# Patient Record
Sex: Male | Born: 1965 | Race: White | Hispanic: No | Marital: Married | Smoking: Never smoker
Health system: Southern US, Community
[De-identification: ages and names within clinical notes are randomized; demographics above are authoritative.]

## PROBLEM LIST (undated history)

## (undated) DIAGNOSIS — I10 Essential (primary) hypertension: Secondary | ICD-10-CM

## (undated) DIAGNOSIS — E785 Hyperlipidemia, unspecified: Secondary | ICD-10-CM

## (undated) DIAGNOSIS — G4730 Sleep apnea, unspecified: Secondary | ICD-10-CM

## (undated) DIAGNOSIS — T7840XA Allergy, unspecified, initial encounter: Secondary | ICD-10-CM

## (undated) HISTORY — PX: COLONOSCOPY: SHX174

## (undated) HISTORY — DX: Hyperlipidemia, unspecified: E78.5

## (undated) HISTORY — PX: MANDIBLE SURGERY: SHX707

## (undated) HISTORY — DX: Allergy, unspecified, initial encounter: T78.40XA

## (undated) HISTORY — DX: Sleep apnea, unspecified: G47.30

## (undated) HISTORY — DX: Essential (primary) hypertension: I10

## (undated) HISTORY — PX: TONSILLECTOMY: SUR1361

## (undated) HISTORY — PX: KNEE SURGERY: SHX244

---

## 2004-12-05 ENCOUNTER — Ambulatory Visit (HOSPITAL_COMMUNITY): Admission: RE | Admit: 2004-12-05 | Discharge: 2004-12-05 | Payer: Self-pay | Admitting: Internal Medicine

## 2009-03-17 ENCOUNTER — Emergency Department (HOSPITAL_COMMUNITY): Admission: EM | Admit: 2009-03-17 | Discharge: 2009-03-17 | Payer: Self-pay | Admitting: Emergency Medicine

## 2009-08-07 ENCOUNTER — Encounter (INDEPENDENT_AMBULATORY_CARE_PROVIDER_SITE_OTHER): Payer: Self-pay | Admitting: *Deleted

## 2009-10-10 ENCOUNTER — Encounter (INDEPENDENT_AMBULATORY_CARE_PROVIDER_SITE_OTHER): Payer: Self-pay | Admitting: *Deleted

## 2009-10-14 ENCOUNTER — Telehealth: Payer: Self-pay | Admitting: Internal Medicine

## 2009-10-14 ENCOUNTER — Ambulatory Visit: Payer: Self-pay | Admitting: Internal Medicine

## 2009-10-17 ENCOUNTER — Ambulatory Visit: Payer: Self-pay | Admitting: Internal Medicine

## 2009-10-19 ENCOUNTER — Encounter: Payer: Self-pay | Admitting: Internal Medicine

## 2010-04-15 NOTE — Letter (Signed)
Summary: Patient Notice- Polyp Results   Gastroenterology  [address] [place], [place] [address]   Phone: [phone]  Fax: [phone]        October 19, 2009 MRN: [id number]    Jason Rivas [address] [place], [place]  [address]    Dear [name],  I am pleased to inform you that the colon polyp(s) removed during your recent colonoscopy was (were) found to be benign (no cancer detected) upon pathologic examination.  I recommend you have a repeat colonoscopy examination in 5 years to look for recurrent polyps, as having colon polyps increases your risk for having recurrent polyps or even colon cancer in the future.  Should you develop new or worsening symptoms of abdominal pain, bowel habit changes or bleeding from the rectum or bowels, please schedule an evaluation with either your primary care physician or with me.  Additional information/recommendations:  __ No further action with gastroenterology is needed at this time. Please      follow-up with your primary care physician for your other healthcare      needs.   Please call us if you are having persistent problems or have questions about your condition that have not been fully answered at this time.  Sincerely,  Hilarie Fredrickson MD  This letter has been electronically signed by your physician.  Appended Document: Patient Notice- Polyp Results letter mailed

## 2010-04-15 NOTE — Procedures (Signed)
Summary: Colonoscopy  Patient: Jason Rivas Note: All result statuses are Final unless otherwise noted.  Tests: (1) Colonoscopy (COL)   COL Colonoscopy           DONE     [facility]     [address]     [place], [place]  [address]           COLONOSCOPY PROCEDURE REPORT           PATIENT:  Jason Rivas, Jason Rivas  MR#:  [id number]     BIRTHDATE:  1965-06-08, 43 yrs. old  GENDER:  male     ENDOSCOPIST:  Wilhemina Bonito. Eda Keys, MD     REF. BY:  Creola Corn, M.D.     PROCEDURE DATE:  10/17/2009     PROCEDURE:  Colonoscopy with snare polypectomy x 1     ASA CLASS:  Class I     INDICATIONS:  Elevated Risk Screening ; mom w/ CRC age 30     MEDICATIONS:   Fentanyl 100 mcg IV, Versed 10 mg IV, Benadryl 50     mg IV           DESCRIPTION OF PROCEDURE:   After the risks benefits and     alternatives of the procedure were thoroughly explained, informed     consent was obtained.  Digital rectal exam was performed and     revealed no abnormalities.   The LB [id number] endoscope     was introduced through the anus and advanced to the cecum, which     was identified by both the appendix and ileocecal valve, without     limitations.Time to cecum = 1:48 min. The quality of the prep was     excellent, using MoviPrep.  The instrument was then slowly     withdrawn (time = 14:41 min.) as the colon was fully examined.     <<PROCEDUREIMAGES>>           FINDINGS:  A 5mm pedunculated polyp was found in the sigmoid     colon. Polyp was snared without cautery. Retrieval was successful.     Mild diverticulosis was found in the sigmoid colon.  This was     otherwise a normal examination of the colon.   Retroflexed views     in the rectum revealed internal hemorrhoids.    The scope was then     withdrawn from the patient and the procedure completed.           COMPLICATIONS:  None           ENDOSCOPIC IMPRESSION:     1) Pedunculated polyp in the sigmoid colon - removed     2) Mild diverticulosis  in the sigmoid colon     3) Otherwise normal examination     4) Small Internal hemorrhoids           RECOMMENDATIONS:     1) Follow up colonoscopy in 5 years           ______________________________     Wilhemina Bonito. Eda Keys, MD           CC:  Creola Corn, MD;The Patient           n.     Rosalie DoctorWilhemina Bonito. Eda Keys at 10/17/2009 09:20 AM           Shelly Flatten, [id number]  Note: An exclamation mark (!) indicates a result that was not dispersed into the  flowsheet. Document Creation Date: 10/17/2009 9:20 AM _______________________________________________________________________  (1) Order result status: Final Collection or observation date-time: 10/17/2009 09:13 Requested date-time:  Receipt date-time:  Reported date-time:  Referring Physician:   Ordering Physician: Fransico Setters [phone]) Specimen Source:  Source: Launa Grill Order Number: [id number] Lab site:   Appended Document: Colonoscopy     Procedures Next Due Date:    Colonoscopy: 10/2014

## 2010-04-15 NOTE — Miscellaneous (Signed)
Summary: [facility] PV  Clinical Lists Changes  Medications: Added new medication of MOVIPREP 100 GM  SOLR (PEG-KCL-NACL-NASULF-NA ASC-C) As per prep instructions. - Signed Rx of MOVIPREP 100 GM  SOLR (PEG-KCL-NACL-NASULF-NA ASC-C) As per prep instructions.;  #1 x 0;  Signed;  Entered by: Ezra Sites RN;  Authorized by: Hilarie Fredrickson MD;  Method used: Electron[facility] to [facility] [address] [id number]*, [address], [place], [place], [place]  [address], Ph: [phone] or [phone], Fax: [phone] Observations: Added new observation of NKA: T (10/14/2009 7:52)    Prescriptions: MOVIPREP 100 GM  SOLR (PEG-KCL-NACL-NASULF-NA ASC-C) As per prep instructions.  #1 x 0   Entered by:   Ezra Sites RN   Authorized by:   Hilarie Fredrickson MD   Signed by:   Ezra Sites RN on 10/14/2009   Method used:   [facility] to        [facility] [address] [phone]* (retail)       [address]       [place], [place]  [address]       Ph: [phone] or [phone]       Fax: [phone]   RxID:   [id number]

## 2010-04-15 NOTE — Progress Notes (Signed)
Summary: speak to Doc  Phone Note Call from Patient Call back at Home Phone [phone]   Caller: Patient Call For: Marina Goodell Reason for Call: Talk to Nurse Summary of Call: Patient wants to speak to Dr Marina Goodell directly regarding procedure Thrus. Initial call taken by: Tawni Levy,  October 14, 2009 8:33 AM  Follow-up for Phone Call        Explained to pt.the policy that due to sedation someone over 18 must  be here with pt. and drive them home.Says he has had colon elsewhere before and no one had to stay. Claiming this is a ridiculous  policy as his wife's time is too valuable to sit here for 3 hrs.says he may cx. procedure that he will call back. Follow-up by: Teryl Lucy RN,  October 14, 2009 10:06 AM  Additional Follow-up for Phone Call Additional follow up Details #1::        Dixie. Please try to explain our policy to this patient Additional Follow-up by: Hilarie Fredrickson MD,  October 14, 2009 10:19 AM    Additional Follow-up for Phone Call Additional follow up Details #2::    called pt at 1530; left message on ID'd voice mail to please call me back. Follow-up by: Quincy Carnes RN,  October 14, 2009 3:30 PM  Additional Follow-up for Phone Call Additional follow up Details #3:: Details for Additional Follow-up Action Taken: ok, thanks.  Hilarie Fredrickson MD  October 15, 2009 8:26 AM    Appended Document: speak to Doc Spoke with Patient on 10/15/09; arrangements made for spouse to arrive at 9:15.  Patient to be done on schedule.  Approved by Dr. Marina Goodell.

## 2010-04-15 NOTE — Letter (Signed)
Summary: Previsit letter  [facility]  [address] [place], [place] [address]   Phone: [phone]  Fax: [phone]       08/07/2009 MRN: [id number]  Jason Rivas [address] [place], [place]  [address]  Dear [name],  Welcome to the Gastroenterology Division at [facility].    You are scheduled to see a nurse for your pre-procedure visit on 10-14-09 at 8:00a.m. on the 3rd floor at [facility], [address]  We ask that you try to arrive at our office 15 minutes prior to your appointment time to allow for check-in.  Your nurse visit will consist of discussing your medical and surgical history, your immediate family medical history, and your medications.    Please bring a complete list of all your medications or, if you prefer, bring the medication bottles and we will list them.  We will need to be aware of both prescribed and over the counter drugs.  We will need to know exact dosage information as well.  If you are on blood thinners (Coumadin, Plavix, Aggrenox, Ticlid, etc.) please call our office today/prior to your appointment, as we need to consult with your physician about holding your medication.   Please be prepared to read and sign documents such as consent forms, a financial agreement, and acknowledgement forms.  If necessary, and with your consent, a friend or relative is welcome to sit-in on the nurse visit with you.  Please bring your insurance card so that we may make a copy of it.  If your insurance requires a referral to see a specialist, please bring your referral form from your primary care physician.  No co-pay is required for this nurse visit.     If you cannot keep your appointment, please call [phone] to cancel or reschedule prior to your appointment date.  This allows [place] the opportunity to schedule an appointment for another patient in need of care.    Thank you for choosing [facility] for your medical needs.   We appreciate the opportunity to care for you.  Please visit [place] at our website  to learn more about our practice.                     Sincerely.                                                                                                                   The Gastroenterology Division

## 2010-04-15 NOTE — Letter (Signed)
Summary: [facility] Instructions  [facility]  [address] [place], [place] [address]   Phone: [phone]  Fax: [phone]       Jason Rivas    Oct 30, 1965    MRN: [id number]        Procedure Day /Date:  Thursday 10/17/2009     Arrival Time: 7:30 am      Procedure Time: 8:30 am     Location of Procedure:                    _x _  [facility] (4th Floor)                        PREPARATION FOR COLONOSCOPY WITH MOVIPREP   Starting 5 days prior to your procedure Saturday 7/30 do not eat nuts, seeds, popcorn, corn, beans, peas,  salads, or any raw vegetables.  Do not take any fiber supplements (e.g. Metamucil, Citrucel, and Benefiber).  THE DAY BEFORE YOUR PROCEDURE         DATE: Wednesday 8/3  1.  Drink clear liquids the entire day-NO SOLID FOOD  2.  Do not drink anything colored red or purple.  Avoid juices with pulp.  No orange juice.  3.  Drink at least 64 oz. (8 glasses) of fluid/clear liquids during the day to prevent dehydration and help the prep work efficiently.  CLEAR LIQUIDS INCLUDE: Water Jello Ice Popsicles Tea (sugar ok, no milk/cream) Powdered fruit flavored drinks Coffee (sugar ok, no milk/cream) Gatorade Juice: apple, white grape, white cranberry  Lemonade Clear bullion, consomm, broth Carbonated beverages (any kind) Strained chicken noodle soup Hard Candy                             4.  In the morning, mix first dose of MoviPrep solution:    Empty 1 Pouch A and 1 Pouch B into the disposable container    Add lukewarm drinking water to the top line of the container. Mix to dissolve    Refrigerate (mixed solution should be used within 24 hrs)  5.  Begin drinking the prep at 5:00 p.m. The MoviPrep container is divided by 4 marks.   Every 15 minutes drink the solution down to the next mark (approximately 8 oz) until the full liter is complete.   6.  Follow completed prep with 16 oz of clear liquid of your choice  (Nothing red or purple).  Continue to drink clear liquids until bedtime.  7.  Before going to bed, mix second dose of MoviPrep solution:    Empty 1 Pouch A and 1 Pouch B into the disposable container    Add lukewarm drinking water to the top line of the container. Mix to dissolve    Refrigerate  THE DAY OF YOUR PROCEDURE      DATE: Thursday 8/4  Beginning at 3:30 a.m. (5 hours before procedure):         1. Every 15 minutes, drink the solution down to the next mark (approx 8 oz) until the full liter is complete.  2. Follow completed prep with 16 oz. of clear liquid of your choice.    3. You may drink clear liquids until 6:30 am (2 HOURS BEFORE PROCEDURE).   MEDICATION INSTRUCTIONS  Unless otherwise instructed, you should take regular prescription medications with a small sip of water   as early as possible the morning of  your procedure.         OTHER INSTRUCTIONS  You will need a responsible adult at least 47 years of age to accompany you and drive you home.   This person must remain in the waiting room during your procedure.  Wear loose fitting clothing that is easily removed.  Leave jewelry and other valuables at home.  However, you may wish to bring a book to read or  an iPod/MP3 player to listen to music as you wait for your procedure to start.  Remove all body piercing jewelry and leave at home.  Total time from sign-in until discharge is approximately 2-3 hours.  You should go home directly after your procedure and rest.  You can resume normal activities the  day after your procedure.  The day of your procedure you should not:   Drive   Make legal decisions   Operate machinery   Drink alcohol   Return to work  You will receive specific instructions about eating, activities and medications before you leave.    The above instructions have been reviewed and explained to me by   Ezra Sites RN  October 14, 2009 8:13 AM     I fully understand and  can verbalize these instructions _____________________________ Date _________

## 2010-04-23 ENCOUNTER — Other Ambulatory Visit: Payer: Self-pay | Admitting: Dermatology

## 2010-06-01 LAB — URINALYSIS, ROUTINE W REFLEX MICROSCOPIC
Bilirubin Urine: NEGATIVE
Glucose, UA: NEGATIVE mg/dL
Ketones, ur: NEGATIVE mg/dL
Nitrite: NEGATIVE
Protein, ur: NEGATIVE mg/dL
Specific Gravity, Urine: 1.018 (ref 1.005–1.030)
pH: 8 (ref 5.0–8.0)

## 2010-06-01 LAB — CBC
Platelets: 245 10*3/uL (ref 150–400)
RBC: 5.07 MIL/uL (ref 4.22–5.81)

## 2010-06-01 LAB — COMPREHENSIVE METABOLIC PANEL
AST: 46 U/L — ABNORMAL HIGH (ref 0–37)
Albumin: 4.6 g/dL (ref 3.5–5.2)
Alkaline Phosphatase: 65 U/L (ref 39–117)
BUN: 16 mg/dL (ref 6–23)
Calcium: 9.5 mg/dL (ref 8.4–10.5)
Creatinine, Ser: 0.98 mg/dL (ref 0.4–1.5)
GFR calc Af Amer: >60 mL/min (ref >60)

## 2010-06-01 LAB — DIFFERENTIAL
Basophils Absolute: 0 10*3/uL (ref 0.0–0.1)
Basophils Relative: 0 % (ref 0–1)
Eosinophils Absolute: 0 10*3/uL (ref 0.0–0.7)
Eosinophils Relative: 0 % (ref 0–5)
Lymphocytes Relative: 11 % — ABNORMAL LOW (ref 12–46)
Neutrophils Relative %: 86 % — ABNORMAL HIGH (ref 43–77)

## 2013-10-31 ENCOUNTER — Encounter: Payer: Self-pay | Admitting: Internal Medicine

## 2014-02-27 ENCOUNTER — Other Ambulatory Visit: Payer: Self-pay | Admitting: Plastic Surgery

## 2014-07-06 ENCOUNTER — Other Ambulatory Visit: Payer: Self-pay | Admitting: Orthopedic Surgery

## 2014-07-11 ENCOUNTER — Other Ambulatory Visit: Payer: Self-pay | Admitting: Orthopedic Surgery

## 2014-07-11 DIAGNOSIS — M25512 Pain in left shoulder: Secondary | ICD-10-CM

## 2014-07-31 ENCOUNTER — Ambulatory Visit
Admission: RE | Admit: 2014-07-31 | Discharge: 2014-07-31 | Disposition: A | Discharge status: Home / Self Care | Payer: BLUE CROSS/BLUE SHIELD | Source: Ambulatory Visit | Attending: Orthopedic Surgery | Admitting: Orthopedic Surgery

## 2014-07-31 DIAGNOSIS — M25512 Pain in left shoulder: Secondary | ICD-10-CM

## 2014-07-31 MED ORDER — IOHEXOL 180 MG/ML  SOLN
15.0000 mL | Freq: Once | INTRAMUSCULAR | Status: AC | PRN
  Administered 2014-07-31: 15 mL via INTRA_ARTICULAR

## 2014-10-03 ENCOUNTER — Encounter: Payer: Self-pay | Admitting: Internal Medicine

## 2014-12-04 ENCOUNTER — Encounter: Payer: Self-pay | Admitting: Internal Medicine

## 2015-01-08 ENCOUNTER — Ambulatory Visit (AMBULATORY_SURGERY_CENTER): Payer: Self-pay | Admitting: *Deleted

## 2015-01-08 VITALS — Ht 71.0 in | Wt 203.4 lb

## 2015-01-08 DIAGNOSIS — Z800 Family history of malignant neoplasm of digestive organs: Secondary | ICD-10-CM

## 2015-01-08 DIAGNOSIS — Z86010 Personal history of colonic polyps: Secondary | ICD-10-CM

## 2015-01-08 NOTE — Progress Notes (Signed)
No egg or soy allergy  No anesthesia or intubation problems per pt  No diet medications taken  Suprep sample given to pt

## 2015-01-22 ENCOUNTER — Encounter: Payer: Self-pay | Admitting: Internal Medicine

## 2015-01-22 ENCOUNTER — Ambulatory Visit (AMBULATORY_SURGERY_CENTER): Payer: BLUE CROSS/BLUE SHIELD | Admitting: Internal Medicine

## 2015-01-22 VITALS — BP 124/63 | HR 58 | Temp 97.7°F | Resp 22 | Ht 71.0 in | Wt 203.0 lb

## 2015-01-22 DIAGNOSIS — K635 Polyp of colon: Secondary | ICD-10-CM

## 2015-01-22 DIAGNOSIS — D123 Benign neoplasm of transverse colon: Secondary | ICD-10-CM | POA: Diagnosis not present

## 2015-01-22 DIAGNOSIS — Z800 Family history of malignant neoplasm of digestive organs: Secondary | ICD-10-CM

## 2015-01-22 DIAGNOSIS — Z86010 Personal history of colonic polyps: Secondary | ICD-10-CM | POA: Diagnosis not present

## 2015-01-22 HISTORY — PX: COLONOSCOPY: SHX174

## 2015-01-22 MED ORDER — SODIUM CHLORIDE 0.9 % IV SOLN: 500.0000 mL | INTRAVENOUS | Status: DC

## 2015-01-22 NOTE — Patient Instructions (Signed)
YOU HAD AN ENDOSCOPIC PROCEDURE TODAY AT THE [facility]:   Refer to the procedure report that was given to you for any specific questions about what was found during the examination.  If the procedure report does not answer your questions, please call your gastroenterologist to clarify.  If you requested that your care partner not be given the details of your procedure findings, then the procedure report has been included in a sealed envelope for you to review at your convenience later.  YOU SHOULD EXPECT: Some feelings of bloating in the abdomen. Passage of more gas than usual.  Walking can help get rid of the air that was put into your GI tract during the procedure and reduce the bloating. If you had a lower endoscopy (such as a colonoscopy or flexible sigmoidoscopy) you may notice spotting of blood in your stool or on the toilet paper. If you underwent a bowel prep for your procedure, you may not have a normal bowel movement for a few days.  Please Note:  You might notice some irritation and congestion in your nose or some drainage.  This is from the oxygen used during your procedure.  There is no need for concern and it should clear up in a day or so.  SYMPTOMS TO REPORT IMMEDIATELY:   Following lower endoscopy (colonoscopy or flexible sigmoidoscopy):  Excessive amounts of blood in the stool  Significant tenderness or worsening of abdominal pains  Swelling of the abdomen that is new, acute  Fever of 100F or higher   For urgent or emergent issues, a gastroenterologist can be reached at any hour by calling [phone].   DIET: Your first meal following the procedure should be a small meal and then it is ok to progress to your normal diet. Heavy or fried foods are harder to digest and may make you feel nauseous or bloated.  Likewise, meals heavy in dairy and vegetables can increase bloating.  Drink plenty of fluids but you should avoid alcoholic beverages for 24  hours.  ACTIVITY:  You should plan to take it easy for the rest of today and you should NOT DRIVE or use heavy machinery until tomorrow (because of the sedation medicines used during the test).    FOLLOW UP: Our staff will call the number listed on your records the next business day following your procedure to check on you and address any questions or concerns that you may have regarding the information given to you following your procedure. If we do not reach you, we will leave a message.  However, if you are feeling well and you are not experiencing any problems, there is no need to return our call.  We will assume that you have returned to your regular daily activities without incident.  If any biopsies were taken you will be contacted by phone or by letter within the next 1-3 weeks.  Please call us at [phone] if you have not heard about the biopsies in 3 weeks.    SIGNATURES/CONFIDENTIALITY: You and/or your care partner have signed paperwork which will be entered into your electronic medical record.  These signatures attest to the fact that that the information above on your After Visit Summary has been reviewed and is understood.  Full responsibility of the confidentiality of this discharge information lies with you and/or your care-partner.  Polyp, diverticulosis, and high fiber diet information given.  

## 2015-01-22 NOTE — Op Note (Signed)
[facility]  [address] [place], [address]   COLONOSCOPY PROCEDURE REPORT  PATIENT: Jason Rivas, Jason Rivas  MR#: [id number] BIRTHDATE: 06/14/1965 , 49  yrs. old GENDER: male ENDOSCOPIST: Eustace Quail, MD REFERRED VO:[id number] Program Recall PROCEDURE DATE:  01/22/2015 PROCEDURE:   Colonoscopy, surveillance and Colonoscopy with snare polypectomy x 1 First Screening Colonoscopy - Avg.  risk and is 50 yrs.  old or older - No.  Prior Negative Screening - Now for repeat screening. N/A  History of Adenoma - Now for follow-up colonoscopy & has been > or = to 3 yrs.  N/A  Polyps removed today? Yes ASA CLASS:   Class II INDICATIONS:Screening for colonic neoplasia and FH Colon or Rectal Adenocarcinoma.. Mother with colon cancer at age 27.Index examination August 2011 with small plastic polyp MEDICATIONS: Monitored anesthesia care and Propofol 340 mg IV  DESCRIPTION OF PROCEDURE:   After the risks benefits and alternatives of the procedure were thoroughly explained, informed consent was obtained.  The digital rectal exam revealed no abnormalities of the rectum.   The LB [id number]  endoscope was introduced through the anus and advanced to the cecum, which was identified by both the appendix and ileocecal valve. No adverse events experienced.   The quality of the prep was excellent. (Suprep was used)  The instrument was then slowly withdrawn as the colon was fully examined. Estimated blood loss is zero unless otherwise noted in this procedure report.   COLON FINDINGS: A single polyp measuring 3 mm in size was found in the transverse colon.  A polypectomy was performed with a cold snare.  The resection was complete, the polyp tissue was completely retrieved and sent to histology.   There was moderate diverticulosis noted in the left colon.   The examination was otherwise normal.  Retroflexed views revealed internal hemorrhoids. The time to cecum = 1.9 Withdrawal  time = 12.2   The scope was withdrawn and the procedure completed. COMPLICATIONS: There were no immediate complications.  ENDOSCOPIC IMPRESSION: 1.   Single polyp was found in the transverse colon; polypectomy was performed with a cold snare 2.   Moderate diverticulosis was noted in the left colon 3.   The examination was otherwise normal  RECOMMENDATIONS: 1. Follow up colonoscopy in 5 years  (family history)  eSigned:  Eustace Quail, MD 01/22/2015 3:43 PM   cc: The Patient and Shon Baton, MD

## 2015-01-22 NOTE — Progress Notes (Signed)
Report to PACU, RN, vss, BBS= Clear.  

## 2015-01-22 NOTE — Progress Notes (Signed)
Called to room to assist during endoscopic procedure.  Patient ID and intended procedure confirmed with present staff. Received instructions for my participation in the procedure from the performing physician.  

## 2015-01-23 ENCOUNTER — Telehealth: Payer: Self-pay | Admitting: Internal Medicine

## 2015-01-23 NOTE — Telephone Encounter (Signed)
°  Follow up Call-  Call back number 01/22/2015  Post procedure Call Back phone  # [phone]  Permission to leave phone message Yes     Patient questions:  Do you have a fever, pain , or abdominal swelling? No. Pain Score  0 *  Have you tolerated food without any problems? Yes.    Have you been able to return to your normal activities? Yes.    Do you have any questions about your discharge instructions: Diet   No. Medications  No. Follow up visit  No.  Do you have questions or concerns about your Care? No.  Actions: * If pain score is 4 or above: No action needed, pain <4.

## 2015-01-28 ENCOUNTER — Encounter: Payer: Self-pay | Admitting: Internal Medicine

## 2015-06-19 ENCOUNTER — Ambulatory Visit
Admission: RE | Admit: 2015-06-19 | Discharge: 2015-06-19 | Disposition: A | Discharge status: Home / Self Care | Payer: BLUE CROSS/BLUE SHIELD | Source: Ambulatory Visit | Attending: Registered Nurse | Admitting: Registered Nurse

## 2015-06-19 ENCOUNTER — Other Ambulatory Visit: Payer: Self-pay | Admitting: Registered Nurse

## 2015-06-19 DIAGNOSIS — N5089 Other specified disorders of the male genital organs: Secondary | ICD-10-CM

## 2017-12-16 DIAGNOSIS — D2262 Melanocytic nevi of left upper limb, including shoulder: Secondary | ICD-10-CM | POA: Diagnosis not present

## 2017-12-16 DIAGNOSIS — L720 Epidermal cyst: Secondary | ICD-10-CM | POA: Diagnosis not present

## 2017-12-16 DIAGNOSIS — D225 Melanocytic nevi of trunk: Secondary | ICD-10-CM | POA: Diagnosis not present

## 2017-12-16 DIAGNOSIS — D2261 Melanocytic nevi of right upper limb, including shoulder: Secondary | ICD-10-CM | POA: Diagnosis not present

## 2018-05-04 DIAGNOSIS — N4341 Spermatocele of epididymis, single: Secondary | ICD-10-CM | POA: Diagnosis not present

## 2018-09-20 DIAGNOSIS — Z03818 Encounter for observation for suspected exposure to other biological agents ruled out: Secondary | ICD-10-CM | POA: Diagnosis not present

## 2018-09-20 DIAGNOSIS — Z20828 Contact with and (suspected) exposure to other viral communicable diseases: Secondary | ICD-10-CM | POA: Diagnosis not present

## 2018-10-18 DIAGNOSIS — Z0000 Encounter for general adult medical examination without abnormal findings: Secondary | ICD-10-CM | POA: Diagnosis not present

## 2018-10-18 DIAGNOSIS — Z125 Encounter for screening for malignant neoplasm of prostate: Secondary | ICD-10-CM | POA: Diagnosis not present

## 2018-10-18 DIAGNOSIS — E7849 Other hyperlipidemia: Secondary | ICD-10-CM | POA: Diagnosis not present

## 2018-10-18 DIAGNOSIS — R82998 Other abnormal findings in urine: Secondary | ICD-10-CM | POA: Diagnosis not present

## 2018-10-24 DIAGNOSIS — N509 Disorder of male genital organs, unspecified: Secondary | ICD-10-CM | POA: Diagnosis not present

## 2018-10-24 DIAGNOSIS — Z0000 Encounter for general adult medical examination without abnormal findings: Secondary | ICD-10-CM | POA: Diagnosis not present

## 2018-10-24 DIAGNOSIS — Z20818 Contact with and (suspected) exposure to other bacterial communicable diseases: Secondary | ICD-10-CM | POA: Diagnosis not present

## 2018-10-24 DIAGNOSIS — F419 Anxiety disorder, unspecified: Secondary | ICD-10-CM | POA: Diagnosis not present

## 2018-10-24 DIAGNOSIS — Z1331 Encounter for screening for depression: Secondary | ICD-10-CM | POA: Diagnosis not present

## 2018-10-24 DIAGNOSIS — M1990 Unspecified osteoarthritis, unspecified site: Secondary | ICD-10-CM | POA: Diagnosis not present

## 2018-10-24 DIAGNOSIS — E785 Hyperlipidemia, unspecified: Secondary | ICD-10-CM | POA: Diagnosis not present

## 2018-10-27 ENCOUNTER — Other Ambulatory Visit: Payer: Self-pay | Admitting: Internal Medicine

## 2018-10-27 DIAGNOSIS — E785 Hyperlipidemia, unspecified: Secondary | ICD-10-CM

## 2018-11-09 ENCOUNTER — Other Ambulatory Visit: Payer: BLUE CROSS/BLUE SHIELD

## 2018-11-10 DIAGNOSIS — E785 Hyperlipidemia, unspecified: Secondary | ICD-10-CM | POA: Diagnosis not present

## 2018-11-10 DIAGNOSIS — R7989 Other specified abnormal findings of blood chemistry: Secondary | ICD-10-CM | POA: Diagnosis not present

## 2018-11-10 DIAGNOSIS — E559 Vitamin D deficiency, unspecified: Secondary | ICD-10-CM | POA: Diagnosis not present

## 2018-11-10 DIAGNOSIS — Z0000 Encounter for general adult medical examination without abnormal findings: Secondary | ICD-10-CM | POA: Diagnosis not present

## 2018-11-18 ENCOUNTER — Ambulatory Visit
Admission: RE | Admit: 2018-11-18 | Discharge: 2018-11-18 | Disposition: A | Discharge status: Home / Self Care | Payer: No Typology Code available for payment source | Source: Ambulatory Visit | Attending: Internal Medicine | Admitting: Internal Medicine

## 2018-11-18 DIAGNOSIS — E785 Hyperlipidemia, unspecified: Secondary | ICD-10-CM

## 2019-01-17 ENCOUNTER — Other Ambulatory Visit: Payer: Self-pay

## 2019-01-17 DIAGNOSIS — Z20822 Contact with and (suspected) exposure to covid-19: Secondary | ICD-10-CM

## 2019-01-19 LAB — NOVEL CORONAVIRUS, NAA: SARS-CoV-2, NAA: NOT DETECTED

## 2019-02-07 DIAGNOSIS — R7989 Other specified abnormal findings of blood chemistry: Secondary | ICD-10-CM | POA: Diagnosis not present

## 2019-02-07 DIAGNOSIS — E785 Hyperlipidemia, unspecified: Secondary | ICD-10-CM | POA: Diagnosis not present

## 2019-02-22 DIAGNOSIS — G8929 Other chronic pain: Secondary | ICD-10-CM | POA: Diagnosis not present

## 2019-02-22 DIAGNOSIS — M25521 Pain in right elbow: Secondary | ICD-10-CM | POA: Diagnosis not present

## 2019-03-08 DIAGNOSIS — D171 Benign lipomatous neoplasm of skin and subcutaneous tissue of trunk: Secondary | ICD-10-CM | POA: Diagnosis not present

## 2019-03-08 DIAGNOSIS — D2262 Melanocytic nevi of left upper limb, including shoulder: Secondary | ICD-10-CM | POA: Diagnosis not present

## 2019-03-08 DIAGNOSIS — D225 Melanocytic nevi of trunk: Secondary | ICD-10-CM | POA: Diagnosis not present

## 2019-03-08 DIAGNOSIS — L821 Other seborrheic keratosis: Secondary | ICD-10-CM | POA: Diagnosis not present

## 2019-03-25 DIAGNOSIS — Z20828 Contact with and (suspected) exposure to other viral communicable diseases: Secondary | ICD-10-CM | POA: Diagnosis not present

## 2019-03-25 DIAGNOSIS — Z01812 Encounter for preprocedural laboratory examination: Secondary | ICD-10-CM | POA: Diagnosis not present

## 2019-03-25 DIAGNOSIS — Z20822 Contact with and (suspected) exposure to covid-19: Secondary | ICD-10-CM | POA: Diagnosis not present

## 2019-03-28 DIAGNOSIS — M25721 Osteophyte, right elbow: Secondary | ICD-10-CM | POA: Diagnosis not present

## 2019-03-28 DIAGNOSIS — M25521 Pain in right elbow: Secondary | ICD-10-CM | POA: Diagnosis not present

## 2019-03-28 DIAGNOSIS — Z79899 Other long term (current) drug therapy: Secondary | ICD-10-CM | POA: Diagnosis not present

## 2019-03-28 DIAGNOSIS — M94221 Chondromalacia, right elbow: Secondary | ICD-10-CM | POA: Diagnosis not present

## 2019-03-28 DIAGNOSIS — M24021 Loose body in right elbow: Secondary | ICD-10-CM | POA: Diagnosis not present

## 2019-03-28 DIAGNOSIS — G8929 Other chronic pain: Secondary | ICD-10-CM | POA: Diagnosis not present

## 2019-03-28 DIAGNOSIS — G4730 Sleep apnea, unspecified: Secondary | ICD-10-CM | POA: Diagnosis not present

## 2019-03-28 DIAGNOSIS — Z5333 Arthroscopic surgical procedure converted to open procedure: Secondary | ICD-10-CM | POA: Diagnosis not present

## 2019-04-17 HISTORY — PX: ELBOW SURGERY: SHX618

## 2019-05-06 DIAGNOSIS — Z20822 Contact with and (suspected) exposure to covid-19: Secondary | ICD-10-CM | POA: Diagnosis not present

## 2019-08-24 DIAGNOSIS — L820 Inflamed seborrheic keratosis: Secondary | ICD-10-CM | POA: Diagnosis not present

## 2019-10-23 DIAGNOSIS — Z125 Encounter for screening for malignant neoplasm of prostate: Secondary | ICD-10-CM | POA: Diagnosis not present

## 2019-10-23 DIAGNOSIS — Z0000 Encounter for general adult medical examination without abnormal findings: Secondary | ICD-10-CM | POA: Diagnosis not present

## 2019-10-23 DIAGNOSIS — R5383 Other fatigue: Secondary | ICD-10-CM | POA: Diagnosis not present

## 2019-10-23 DIAGNOSIS — E7849 Other hyperlipidemia: Secondary | ICD-10-CM | POA: Diagnosis not present

## 2019-10-27 DIAGNOSIS — Z0000 Encounter for general adult medical examination without abnormal findings: Secondary | ICD-10-CM | POA: Diagnosis not present

## 2019-10-27 DIAGNOSIS — Z23 Encounter for immunization: Secondary | ICD-10-CM | POA: Diagnosis not present

## 2019-10-27 DIAGNOSIS — R82998 Other abnormal findings in urine: Secondary | ICD-10-CM | POA: Diagnosis not present

## 2019-10-27 DIAGNOSIS — E785 Hyperlipidemia, unspecified: Secondary | ICD-10-CM | POA: Diagnosis not present

## 2019-10-31 DIAGNOSIS — Z1212 Encounter for screening for malignant neoplasm of rectum: Secondary | ICD-10-CM | POA: Diagnosis not present

## 2019-11-09 DIAGNOSIS — E785 Hyperlipidemia, unspecified: Secondary | ICD-10-CM | POA: Diagnosis not present

## 2019-11-09 DIAGNOSIS — R7989 Other specified abnormal findings of blood chemistry: Secondary | ICD-10-CM | POA: Diagnosis not present

## 2019-11-09 DIAGNOSIS — Z0000 Encounter for general adult medical examination without abnormal findings: Secondary | ICD-10-CM | POA: Diagnosis not present

## 2019-11-09 DIAGNOSIS — E559 Vitamin D deficiency, unspecified: Secondary | ICD-10-CM | POA: Diagnosis not present

## 2019-12-13 ENCOUNTER — Encounter: Payer: Self-pay | Admitting: Internal Medicine

## 2019-12-26 ENCOUNTER — Encounter: Payer: Self-pay | Admitting: Gastroenterology

## 2020-01-30 ENCOUNTER — Other Ambulatory Visit: Payer: Self-pay

## 2020-01-30 ENCOUNTER — Ambulatory Visit (AMBULATORY_SURGERY_CENTER): Payer: BC Managed Care – PPO | Admitting: *Deleted

## 2020-01-30 ENCOUNTER — Encounter: Payer: Self-pay | Admitting: Internal Medicine

## 2020-01-30 VITALS — Ht 71.0 in | Wt 192.0 lb

## 2020-01-30 DIAGNOSIS — Z800 Family history of malignant neoplasm of digestive organs: Secondary | ICD-10-CM

## 2020-01-30 MED ORDER — SUTAB 1479-225-188 MG PO TABS: 1.0000 | ORAL_TABLET | ORAL | 0 refills | Status: DC

## 2020-01-30 NOTE — Progress Notes (Signed)
Patient no show PV today, so it was done over the phone. Name,DOB and address verified. Insurance verified. Packet of Prep instructions mailed to patient including copy of a consent form -pt is aware. sutab Coupon included. Patient understands to call us back with any questions or concerns. COVID-19 vaccines completed in 11/2019 per pt.

## 2020-02-13 ENCOUNTER — Ambulatory Visit (AMBULATORY_SURGERY_CENTER): Payer: BC Managed Care – PPO | Admitting: Internal Medicine

## 2020-02-13 ENCOUNTER — Encounter: Payer: Self-pay | Admitting: Internal Medicine

## 2020-02-13 ENCOUNTER — Other Ambulatory Visit: Payer: Self-pay

## 2020-02-13 VITALS — BP 116/65 | HR 53 | Temp 97.1°F | Resp 14 | Ht 71.0 in | Wt 192.0 lb

## 2020-02-13 DIAGNOSIS — Z1211 Encounter for screening for malignant neoplasm of colon: Secondary | ICD-10-CM | POA: Diagnosis not present

## 2020-02-13 DIAGNOSIS — Z800 Family history of malignant neoplasm of digestive organs: Secondary | ICD-10-CM | POA: Diagnosis not present

## 2020-02-13 MED ORDER — SODIUM CHLORIDE 0.9 % IV SOLN: 500.0000 mL | Freq: Once | INTRAVENOUS | Status: DC

## 2020-02-13 NOTE — Patient Instructions (Signed)
Handouts Provided:  Diverticulosis ? ?YOU HAD AN ENDOSCOPIC PROCEDURE TODAY AT THE [facility]:   Refer to the procedure report that was given to you for any specific questions about what was found during the examination.  If the procedure report does not answer your questions, please call your gastroenterologist to clarify.  If you requested that your care partner not be given the details of your procedure findings, then the procedure report has been included in a sealed envelope for you to review at your convenience later. ? ?YOU SHOULD EXPECT: Some feelings of bloating in the abdomen. Passage of more gas than usual.  Walking can help get rid of the air that was put into your GI tract during the procedure and reduce the bloating. If you had a lower endoscopy (such as a colonoscopy or flexible sigmoidoscopy) you may notice spotting of blood in your stool or on the toilet paper. If you underwent a bowel prep for your procedure, you may not have a normal bowel movement for a few days. ? ?Please Note:  You might notice some irritation and congestion in your nose or some drainage.  This is from the oxygen used during your procedure.  There is no need for concern and it should clear up in a day or so. ? ?SYMPTOMS TO REPORT IMMEDIATELY: ? ?Following lower endoscopy (colonoscopy or flexible sigmoidoscopy): ? Excessive amounts of blood in the stool ? Significant tenderness or worsening of abdominal pains ? Swelling of the abdomen that is new, acute ? Fever of 100?F or higher ? ?For urgent or emergent issues, a gastroenterologist can be reached at any hour by calling [phone]. ?Do not use MyChart messaging for urgent concerns.  ? ? ?DIET:  We do recommend a small meal at first, but then you may proceed to your regular diet.  Drink plenty of fluids but you should avoid alcoholic beverages for 24 hours. ? ?ACTIVITY:  You should plan to take it easy for the rest of today and you should NOT DRIVE or use heavy  machinery until tomorrow (because of the sedation medicines used during the test).   ? ?FOLLOW UP: ?Our staff will call the number listed on your records 48-72 hours following your procedure to check on you and address any questions or concerns that you may have regarding the information given to you following your procedure. If we do not reach you, we will leave a message.  We will attempt to reach you two times.  During this call, we will ask if you have developed any symptoms of COVID 19. If you develop any symptoms (ie: fever, flu-like symptoms, shortness of breath, cough etc.) before then, please call [phone].  If you test positive for Covid 19 in the 2 weeks post procedure, please call and report this information to us.   ? ?If any biopsies were taken you will be contacted by phone or by letter within the next 1-3 weeks.  Please call us at [phone] if you have not heard about the biopsies in 3 weeks.  ? ? ?SIGNATURES/CONFIDENTIALITY: ?You and/or your care partner have signed paperwork which will be entered into your electronic medical record.  These signatures attest to the fact that that the information above on your After Visit Summary has been reviewed and is understood.  Full responsibility of the confidentiality of this discharge information lies with you and/or your care-partner. ? ?

## 2020-02-13 NOTE — Op Note (Signed)
Jason Rivas: Jason Rivas Procedure Date: 02/13/2020 3:24 PM MRN: [id number] Endoscopist: Docia Chuck. Henrene Pastor , MD Age: 54 Referring MD:  Date of Birth: 02-Jul-1965 Gender: Male Account #: [id number] Procedure:                Colonoscopy Indications:              Screening in patient at increased risk: Colorectal                            cancer in mother before at 62. Previous                            examinations 2011 and 2016 were negative for                            neoplasia (diminutive hyperplastic polyps). Medicines:                Monitored Anesthesia Care Procedure:                Pre-Anesthesia Assessment:                           - Prior to the procedure, a History and Physical                            was performed, and patient medications and                            allergies were reviewed. The patient's tolerance of                            previous anesthesia was also reviewed. The risks                            and benefits of the procedure and the sedation                            options and risks were discussed with the patient.                            All questions were answered, and informed consent                            was obtained. Prior Anticoagulants: The patient has                            taken no previous anticoagulant or antiplatelet                            agents. After reviewing the risks and benefits, the                            patient was deemed in satisfactory condition to  undergo the procedure.                           After obtaining informed consent, the colonoscope                            was passed under direct vision. Throughout the                            procedure, the patient's blood pressure, pulse, and                            oxygen saturations were monitored continuously. The                            Colonoscope was introduced through the anus  and                            advanced to the the cecum, identified by                            appendiceal orifice and ileocecal valve. The                            ileocecal valve, appendiceal orifice, and rectum                            were photographed. The quality of the bowel                            preparation was excellent. The colonoscopy was                            performed without difficulty. The patient tolerated                            the procedure well. The bowel preparation used was                            SUPREP via split dose instruction. Scope In: 3:31:49 PM Scope Out: 3:43:50 PM Scope Withdrawal Time: 0 hours 9 minutes 0 seconds  Total Procedure Duration: 0 hours 12 minutes 1 second  Findings:                 Multiple diverticula were found in the left colon.                           The exam was otherwise without abnormality on                            direct and retroflexion views. Complications:            No immediate complications. Estimated blood loss:  None. Estimated Blood Loss:     Estimated blood loss: none. Impression:               - Diverticulosis in the left colon.                           - The examination was otherwise normal on direct                            and retroflexion views.                           - No specimens collected. Recommendation:           - Repeat colonoscopy in 5 years for surveillance.                           - Patient has a contact number available for                            emergencies. The signs and symptoms of potential                            delayed complications were discussed with the                            patient. Return to normal activities tomorrow.                            Written discharge instructions were provided to the                            patient.                           - Resume previous diet.                           - Continue  present medications. Docia Chuck. Henrene Pastor, MD 02/13/2020 3:49:48 PM This report has been signed electronically.

## 2020-02-13 NOTE — Progress Notes (Signed)
Report to PACU, RN, vss, BBS= Clear.  

## 2020-02-13 NOTE — Progress Notes (Signed)
[facility] 

## 2020-02-15 ENCOUNTER — Telehealth: Payer: Self-pay

## 2020-02-15 NOTE — Telephone Encounter (Signed)
  Follow up Call-  Call back number 02/13/2020  Post procedure Call Back phone  # [phone]  Permission to leave phone message Yes  Some recent data might be hidden     Patient questions:  Do you have a fever, pain , or abdominal swelling? No. Pain Score  0 *  Have you tolerated food without any problems? Yes.    Have you been able to return to your normal activities? Yes.    Do you have any questions about your discharge instructions: Diet   No. Medications  No. Follow up visit  No.  Do you have questions or concerns about your Care? No.  Actions: * If pain score is 4 or above: No action needed, pain <4.  1. Have you developed a fever since your procedure? no  2.   Have you had an respiratory symptoms (SOB or cough) since your procedure? no  3.   Have you tested positive for COVID 19 since your procedure no  4.   Have you had any family members/close contacts diagnosed with the COVID 19 since your procedure?  no   If yes to any of these questions please route to Joylene John, RN and Joella Prince, RN

## 2020-03-20 DIAGNOSIS — D2262 Melanocytic nevi of left upper limb, including shoulder: Secondary | ICD-10-CM | POA: Diagnosis not present

## 2020-03-20 DIAGNOSIS — D225 Melanocytic nevi of trunk: Secondary | ICD-10-CM | POA: Diagnosis not present

## 2020-03-20 DIAGNOSIS — D2261 Melanocytic nevi of right upper limb, including shoulder: Secondary | ICD-10-CM | POA: Diagnosis not present

## 2020-03-20 DIAGNOSIS — L718 Other rosacea: Secondary | ICD-10-CM | POA: Diagnosis not present

## 2020-04-30 DIAGNOSIS — M1711 Unilateral primary osteoarthritis, right knee: Secondary | ICD-10-CM | POA: Diagnosis not present

## 2020-05-01 DIAGNOSIS — M25562 Pain in left knee: Secondary | ICD-10-CM | POA: Diagnosis not present

## 2020-05-01 DIAGNOSIS — M170 Bilateral primary osteoarthritis of knee: Secondary | ICD-10-CM | POA: Diagnosis not present

## 2020-05-01 DIAGNOSIS — E612 Magnesium deficiency: Secondary | ICD-10-CM | POA: Diagnosis not present

## 2020-05-01 DIAGNOSIS — R972 Elevated prostate specific antigen [PSA]: Secondary | ICD-10-CM | POA: Diagnosis not present

## 2020-05-01 DIAGNOSIS — E291 Testicular hypofunction: Secondary | ICD-10-CM | POA: Diagnosis not present

## 2020-05-01 DIAGNOSIS — D6851 Activated protein C resistance: Secondary | ICD-10-CM | POA: Diagnosis not present

## 2020-05-01 DIAGNOSIS — M25561 Pain in right knee: Secondary | ICD-10-CM | POA: Diagnosis not present

## 2020-05-01 DIAGNOSIS — E7211 Homocystinuria: Secondary | ICD-10-CM | POA: Diagnosis not present

## 2020-05-01 DIAGNOSIS — E299 Testicular dysfunction, unspecified: Secondary | ICD-10-CM | POA: Diagnosis not present

## 2020-05-01 DIAGNOSIS — E278 Other specified disorders of adrenal gland: Secondary | ICD-10-CM | POA: Diagnosis not present

## 2020-05-09 ENCOUNTER — Other Ambulatory Visit: Payer: Self-pay | Admitting: Physician Assistant

## 2020-05-09 DIAGNOSIS — M25561 Pain in right knee: Secondary | ICD-10-CM

## 2020-05-28 ENCOUNTER — Ambulatory Visit
Admission: RE | Admit: 2020-05-28 | Discharge: 2020-05-28 | Disposition: A | Discharge status: Home / Self Care | Payer: BC Managed Care – PPO | Source: Ambulatory Visit | Attending: Physician Assistant | Admitting: Physician Assistant

## 2020-05-28 DIAGNOSIS — M25561 Pain in right knee: Secondary | ICD-10-CM

## 2020-05-28 DIAGNOSIS — E291 Testicular hypofunction: Secondary | ICD-10-CM | POA: Diagnosis not present

## 2020-05-28 DIAGNOSIS — M1711 Unilateral primary osteoarthritis, right knee: Secondary | ICD-10-CM | POA: Diagnosis not present

## 2020-05-28 DIAGNOSIS — Z6826 Body mass index (BMI) 26.0-26.9, adult: Secondary | ICD-10-CM | POA: Diagnosis not present

## 2020-06-05 DIAGNOSIS — E291 Testicular hypofunction: Secondary | ICD-10-CM | POA: Diagnosis not present

## 2020-06-05 DIAGNOSIS — M1711 Unilateral primary osteoarthritis, right knee: Secondary | ICD-10-CM | POA: Diagnosis not present

## 2020-06-05 DIAGNOSIS — M25561 Pain in right knee: Secondary | ICD-10-CM | POA: Diagnosis not present

## 2020-06-05 DIAGNOSIS — Z712 Person consulting for explanation of examination or test findings: Secondary | ICD-10-CM | POA: Diagnosis not present

## 2020-06-21 DIAGNOSIS — E291 Testicular hypofunction: Secondary | ICD-10-CM | POA: Diagnosis not present

## 2020-07-02 DIAGNOSIS — E291 Testicular hypofunction: Secondary | ICD-10-CM | POA: Diagnosis not present

## 2020-07-02 DIAGNOSIS — Z712 Person consulting for explanation of examination or test findings: Secondary | ICD-10-CM | POA: Diagnosis not present

## 2020-07-02 DIAGNOSIS — E299 Testicular dysfunction, unspecified: Secondary | ICD-10-CM | POA: Diagnosis not present

## 2020-07-04 DIAGNOSIS — E291 Testicular hypofunction: Secondary | ICD-10-CM | POA: Diagnosis not present

## 2020-07-16 DIAGNOSIS — S96822A Laceration of other specified muscles and tendons at ankle and foot level, left foot, initial encounter: Secondary | ICD-10-CM | POA: Diagnosis not present

## 2020-07-16 DIAGNOSIS — W208XXD Other cause of strike by thrown, projected or falling object, subsequent encounter: Secondary | ICD-10-CM | POA: Diagnosis not present

## 2020-07-16 DIAGNOSIS — Y92018 Other place in single-family (private) house as the place of occurrence of the external cause: Secondary | ICD-10-CM | POA: Diagnosis not present

## 2020-07-16 DIAGNOSIS — Y998 Other external cause status: Secondary | ICD-10-CM | POA: Diagnosis not present

## 2020-07-18 DIAGNOSIS — S96822A Laceration of other specified muscles and tendons at ankle and foot level, left foot, initial encounter: Secondary | ICD-10-CM | POA: Diagnosis not present

## 2020-07-18 DIAGNOSIS — M66272 Spontaneous rupture of extensor tendons, left ankle and foot: Secondary | ICD-10-CM | POA: Diagnosis not present

## 2020-07-18 DIAGNOSIS — Y9389 Activity, other specified: Secondary | ICD-10-CM | POA: Diagnosis not present

## 2020-07-23 DIAGNOSIS — S96822D Laceration of other specified muscles and tendons at ankle and foot level, left foot, subsequent encounter: Secondary | ICD-10-CM | POA: Diagnosis not present

## 2020-07-23 DIAGNOSIS — M79672 Pain in left foot: Secondary | ICD-10-CM | POA: Diagnosis not present

## 2020-07-23 DIAGNOSIS — Z4789 Encounter for other orthopedic aftercare: Secondary | ICD-10-CM | POA: Diagnosis not present

## 2020-07-30 DIAGNOSIS — M79672 Pain in left foot: Secondary | ICD-10-CM | POA: Diagnosis not present

## 2020-07-30 DIAGNOSIS — S96822D Laceration of other specified muscles and tendons at ankle and foot level, left foot, subsequent encounter: Secondary | ICD-10-CM | POA: Diagnosis not present

## 2020-07-30 DIAGNOSIS — Z4789 Encounter for other orthopedic aftercare: Secondary | ICD-10-CM | POA: Diagnosis not present

## 2020-08-06 DIAGNOSIS — S96822D Laceration of other specified muscles and tendons at ankle and foot level, left foot, subsequent encounter: Secondary | ICD-10-CM | POA: Diagnosis not present

## 2020-08-06 DIAGNOSIS — E299 Testicular dysfunction, unspecified: Secondary | ICD-10-CM | POA: Diagnosis not present

## 2020-08-06 DIAGNOSIS — M79672 Pain in left foot: Secondary | ICD-10-CM | POA: Diagnosis not present

## 2020-08-06 DIAGNOSIS — Z6826 Body mass index (BMI) 26.0-26.9, adult: Secondary | ICD-10-CM | POA: Diagnosis not present

## 2020-08-06 DIAGNOSIS — Z4789 Encounter for other orthopedic aftercare: Secondary | ICD-10-CM | POA: Diagnosis not present

## 2020-08-06 DIAGNOSIS — E291 Testicular hypofunction: Secondary | ICD-10-CM | POA: Diagnosis not present

## 2020-09-04 ENCOUNTER — Other Ambulatory Visit: Payer: Self-pay

## 2020-09-04 ENCOUNTER — Emergency Department (HOSPITAL_COMMUNITY)
Admission: EM | Admit: 2020-09-04 | Discharge: 2020-09-05 | Disposition: A | Discharge status: Home / Self Care | Payer: BC Managed Care – PPO | Attending: Emergency Medicine | Admitting: Emergency Medicine

## 2020-09-04 ENCOUNTER — Emergency Department (HOSPITAL_COMMUNITY): Payer: BC Managed Care – PPO

## 2020-09-04 DIAGNOSIS — N132 Hydronephrosis with renal and ureteral calculous obstruction: Secondary | ICD-10-CM | POA: Diagnosis not present

## 2020-09-04 DIAGNOSIS — Z5321 Procedure and treatment not carried out due to patient leaving prior to being seen by health care provider: Secondary | ICD-10-CM | POA: Diagnosis not present

## 2020-09-04 DIAGNOSIS — R110 Nausea: Secondary | ICD-10-CM | POA: Diagnosis not present

## 2020-09-04 DIAGNOSIS — R1031 Right lower quadrant pain: Secondary | ICD-10-CM | POA: Diagnosis not present

## 2020-09-04 DIAGNOSIS — I700 Atherosclerosis of aorta: Secondary | ICD-10-CM | POA: Diagnosis not present

## 2020-09-04 DIAGNOSIS — N3289 Other specified disorders of bladder: Secondary | ICD-10-CM | POA: Diagnosis not present

## 2020-09-04 DIAGNOSIS — N200 Calculus of kidney: Secondary | ICD-10-CM

## 2020-09-04 LAB — CBC WITH DIFFERENTIAL/PLATELET
Abs Immature Granulocytes: 0.08 10*3/uL — ABNORMAL HIGH (ref 0.00–0.07)
Basophils Absolute: 0.1 10*3/uL (ref 0.0–0.1)
Basophils Relative: 1 %
Eosinophils Absolute: 0.3 10*3/uL (ref 0.0–0.5)
Eosinophils Relative: 2 %
HCT: 46.6 % (ref 39.0–52.0)
Hemoglobin: 15.5 g/dL (ref 13.0–17.0)
Immature Granulocytes: 1 %
Lymphocytes Relative: 38 %
Lymphs Abs: 5.8 10*3/uL — ABNORMAL HIGH (ref 0.7–4.0)
MCH: 31.4 pg (ref 26.0–34.0)
MCHC: 33.3 g/dL (ref 30.0–36.0)
MCV: 94.5 fL (ref 80.0–100.0)
Monocytes Absolute: 1.2 10*3/uL — ABNORMAL HIGH (ref 0.1–1.0)
Monocytes Relative: 8 %
Neutro Abs: 7.8 10*3/uL — ABNORMAL HIGH (ref 1.7–7.7)
Neutrophils Relative %: 50 %
Platelets: 314 10*3/uL (ref 150–400)
RBC: 4.93 MIL/uL (ref 4.22–5.81)
RDW: 12.8 % (ref 11.5–15.5)
WBC: 15.6 10*3/uL — ABNORMAL HIGH (ref 4.0–10.5)
nRBC: 0 % (ref 0.0–0.2)

## 2020-09-04 LAB — COMPREHENSIVE METABOLIC PANEL
ALT: 41 U/L (ref 0–44)
AST: 36 U/L (ref 15–41)
Albumin: 4.2 g/dL (ref 3.5–5.0)
Alkaline Phosphatase: 52 U/L (ref 38–126)
Anion gap: 11 (ref 5–15)
BUN: 18 mg/dL (ref 6–20)
CO2: 27 mmol/L (ref 22–32)
Calcium: 8.8 mg/dL — ABNORMAL LOW (ref 8.9–10.3)
Chloride: 99 mmol/L (ref 98–111)
Creatinine, Ser: 1.2 mg/dL (ref 0.61–1.24)
GFR, Estimated: >60 mL/min (ref >60)
Glucose, Bld: 125 mg/dL — ABNORMAL HIGH (ref 70–99)
Potassium: 3.4 mmol/L — ABNORMAL LOW (ref 3.5–5.1)
Sodium: 137 mmol/L (ref 135–145)
Total Bilirubin: 0.7 mg/dL (ref 0.3–1.2)
Total Protein: 6.5 g/dL (ref 6.5–8.1)

## 2020-09-04 LAB — URINALYSIS, ROUTINE W REFLEX MICROSCOPIC
Bilirubin Urine: NEGATIVE
Glucose, UA: NEGATIVE mg/dL
Hgb urine dipstick: NEGATIVE
Ketones, ur: NEGATIVE mg/dL
Leukocytes,Ua: NEGATIVE
Nitrite: NEGATIVE
Protein, ur: NEGATIVE mg/dL
Specific Gravity, Urine: 1.017 (ref 1.005–1.030)
pH: 7 (ref 5.0–8.0)

## 2020-09-04 MED ORDER — MORPHINE SULFATE (PF) 4 MG/ML IV SOLN
4.0000 mg | Freq: Once | INTRAVENOUS | Status: AC
  Administered 2020-09-04: 4 mg via INTRAMUSCULAR
  Filled 2020-09-04: qty 1

## 2020-09-04 MED ORDER — ONDANSETRON 4 MG PO TBDP
4.0000 mg | ORAL_TABLET | Freq: Once | ORAL | Status: AC
Start: 2020-09-04 — End: 2020-09-04
  Administered 2020-09-04: 4 mg via ORAL
  Filled 2020-09-04: qty 1

## 2020-09-04 NOTE — ED Provider Notes (Signed)
Emergency Medicine Provider Triage Evaluation Note  Jason Rivas , a 55 y.o. male  was evaluated in triage.  Pt complains of sudden onset right lower quadrant abdominal pain 20 minutes prior to arrival.  10 out of 10 worst pain of life.  He has some associated nausea due to pain symptoms.  He was feeling fine prior to onset of symptoms.  No history of kidney stones.  No history of abdominal surgeries.  Review of Systems  Positive: RLQ abdominal pain, nausea. Negative: Recent illness, fevers.   Physical Exam  BP (!) 175/113 (BP Location: Left Arm)   Pulse 85   Temp 99.2 F (37.3 C)   Resp (!) 28   Ht 5\' 11"  (1.803 m)   Wt 92 kg   SpO2 98%   BMI 28.29 kg/m  Gen:   Awake, crying out in pain. Resp:  Normal effort  MSK:   Moves extremities without difficulty  Other:  Shaking on bed, abdominal exam limited.  Points to RLQ and endorses significant TTP diffusely, but most notable in RLQ/periumbilical region.   Medical Decision Making  Medically screening exam initiated at 9:43 PM.  Appropriate orders placed.  Jenkins Risdon was informed that the remainder of the evaluation will be completed by another provider, this initial triage assessment does not replace that evaluation, and the importance of remaining in the ED until their evaluation is complete.  Will obtain CT abdomen pelvis without contrast to assess for likely obstructing ureterolithiasis.  We will also help evaluate for mental status.  Do not feel as though contrast needed.  Story is less concerning for appendicitis.   Corena Herter, PA-C 09/04/20 [id number]    Daleen Bo, MD 09/05/20 [phone]

## 2020-09-04 NOTE — ED Triage Notes (Signed)
Patient reports RLQ pain with nausea onset this evening , no emesis or diarrhea , denies fever or chills , patient also fell on the way in at resistration .

## 2020-09-04 NOTE — ED Notes (Signed)
Patient fell on ground prior to registration and staff assisted into wheelchair

## 2020-09-05 NOTE — ED Notes (Signed)
Pt left due to wait time  

## 2020-09-12 DIAGNOSIS — M79604 Pain in right leg: Secondary | ICD-10-CM | POA: Diagnosis not present

## 2020-10-15 DIAGNOSIS — E538 Deficiency of other specified B group vitamins: Secondary | ICD-10-CM | POA: Diagnosis not present

## 2020-10-15 DIAGNOSIS — N4341 Spermatocele of epididymis, single: Secondary | ICD-10-CM | POA: Diagnosis not present

## 2020-10-15 DIAGNOSIS — N3943 Post-void dribbling: Secondary | ICD-10-CM | POA: Diagnosis not present

## 2020-10-15 DIAGNOSIS — E278 Other specified disorders of adrenal gland: Secondary | ICD-10-CM | POA: Diagnosis not present

## 2020-10-15 DIAGNOSIS — N200 Calculus of kidney: Secondary | ICD-10-CM | POA: Diagnosis not present

## 2020-10-15 DIAGNOSIS — E7141 Primary carnitine deficiency: Secondary | ICD-10-CM | POA: Diagnosis not present

## 2020-10-22 DIAGNOSIS — E291 Testicular hypofunction: Secondary | ICD-10-CM | POA: Diagnosis not present

## 2020-10-22 DIAGNOSIS — M79671 Pain in right foot: Secondary | ICD-10-CM | POA: Diagnosis not present

## 2020-10-22 DIAGNOSIS — M79672 Pain in left foot: Secondary | ICD-10-CM | POA: Diagnosis not present

## 2020-10-22 DIAGNOSIS — S96822D Laceration of other specified muscles and tendons at ankle and foot level, left foot, subsequent encounter: Secondary | ICD-10-CM | POA: Diagnosis not present

## 2020-10-29 DIAGNOSIS — Z125 Encounter for screening for malignant neoplasm of prostate: Secondary | ICD-10-CM | POA: Diagnosis not present

## 2020-10-29 DIAGNOSIS — E785 Hyperlipidemia, unspecified: Secondary | ICD-10-CM | POA: Diagnosis not present

## 2020-10-31 DIAGNOSIS — Z1331 Encounter for screening for depression: Secondary | ICD-10-CM | POA: Diagnosis not present

## 2020-10-31 DIAGNOSIS — Z1212 Encounter for screening for malignant neoplasm of rectum: Secondary | ICD-10-CM | POA: Diagnosis not present

## 2020-10-31 DIAGNOSIS — Z0000 Encounter for general adult medical examination without abnormal findings: Secondary | ICD-10-CM | POA: Diagnosis not present

## 2020-10-31 DIAGNOSIS — R82998 Other abnormal findings in urine: Secondary | ICD-10-CM | POA: Diagnosis not present

## 2020-10-31 DIAGNOSIS — Z1389 Encounter for screening for other disorder: Secondary | ICD-10-CM | POA: Diagnosis not present

## 2020-10-31 DIAGNOSIS — E785 Hyperlipidemia, unspecified: Secondary | ICD-10-CM | POA: Diagnosis not present

## 2020-11-25 DIAGNOSIS — N200 Calculus of kidney: Secondary | ICD-10-CM | POA: Diagnosis not present

## 2021-01-22 DIAGNOSIS — Z0000 Encounter for general adult medical examination without abnormal findings: Secondary | ICD-10-CM | POA: Diagnosis not present

## 2021-01-22 DIAGNOSIS — R7989 Other specified abnormal findings of blood chemistry: Secondary | ICD-10-CM | POA: Diagnosis not present

## 2021-01-22 DIAGNOSIS — E559 Vitamin D deficiency, unspecified: Secondary | ICD-10-CM | POA: Diagnosis not present

## 2021-01-22 DIAGNOSIS — E785 Hyperlipidemia, unspecified: Secondary | ICD-10-CM | POA: Diagnosis not present

## 2021-02-19 ENCOUNTER — Other Ambulatory Visit: Payer: Self-pay | Admitting: Orthopaedic Surgery

## 2021-02-19 DIAGNOSIS — M25561 Pain in right knee: Secondary | ICD-10-CM | POA: Diagnosis not present

## 2021-02-19 DIAGNOSIS — M1711 Unilateral primary osteoarthritis, right knee: Secondary | ICD-10-CM | POA: Diagnosis not present

## 2021-02-19 DIAGNOSIS — E291 Testicular hypofunction: Secondary | ICD-10-CM | POA: Diagnosis not present

## 2021-02-19 DIAGNOSIS — E785 Hyperlipidemia, unspecified: Secondary | ICD-10-CM | POA: Diagnosis not present

## 2021-02-26 ENCOUNTER — Other Ambulatory Visit: Payer: Self-pay | Admitting: Orthopaedic Surgery

## 2021-02-26 DIAGNOSIS — M25561 Pain in right knee: Secondary | ICD-10-CM

## 2021-02-26 DIAGNOSIS — M25562 Pain in left knee: Secondary | ICD-10-CM

## 2021-03-15 ENCOUNTER — Other Ambulatory Visit: Payer: BC Managed Care – PPO

## 2021-04-10 DIAGNOSIS — M25561 Pain in right knee: Secondary | ICD-10-CM | POA: Diagnosis not present

## 2021-04-10 DIAGNOSIS — M1711 Unilateral primary osteoarthritis, right knee: Secondary | ICD-10-CM | POA: Diagnosis not present

## 2021-05-07 DIAGNOSIS — M25561 Pain in right knee: Secondary | ICD-10-CM | POA: Diagnosis not present

## 2021-05-07 DIAGNOSIS — M1711 Unilateral primary osteoarthritis, right knee: Secondary | ICD-10-CM | POA: Diagnosis not present

## 2021-05-07 DIAGNOSIS — E291 Testicular hypofunction: Secondary | ICD-10-CM | POA: Diagnosis not present

## 2021-05-07 DIAGNOSIS — Z712 Person consulting for explanation of examination or test findings: Secondary | ICD-10-CM | POA: Diagnosis not present

## 2021-07-03 DIAGNOSIS — E291 Testicular hypofunction: Secondary | ICD-10-CM | POA: Diagnosis not present

## 2021-07-10 IMAGING — CT CT ABD-PELV W/O CM
2 of 4 series · 16 of 46 positions shown, 18 images · non-contrast
Comparison: None.

CLINICAL DATA: Right lower quadrant abdominal pain. Sudden onset of
pain. Appendicitis versus renal stone.

EXAM:
CT ABDOMEN AND PELVIS WITHOUT CONTRAST
TECHNIQUE: Multidetector CT imaging of the abdomen and pelvis was performed
following the standard protocol without IV contrast.

[Series 3: a/p w/o 5mm · axial · non-contrast · 0.94mm/px · z∈[-486,-41]mm · 13 of 97 slices shown, 15 images]
[im 4/97  soft-tissue]
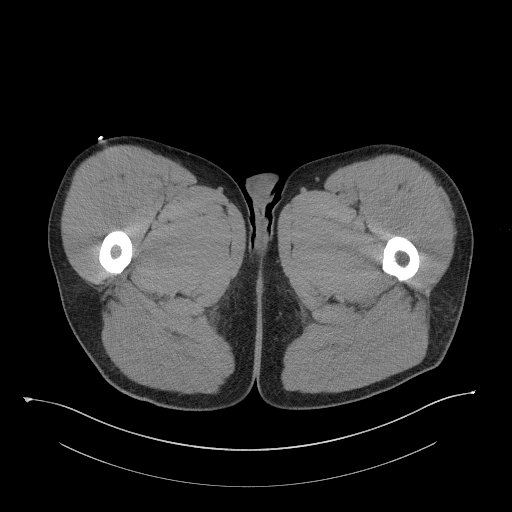
[im 4/97  bone]
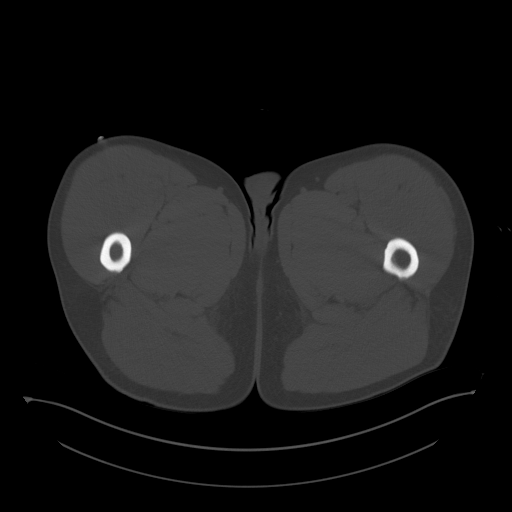
[im 12/97  soft-tissue]
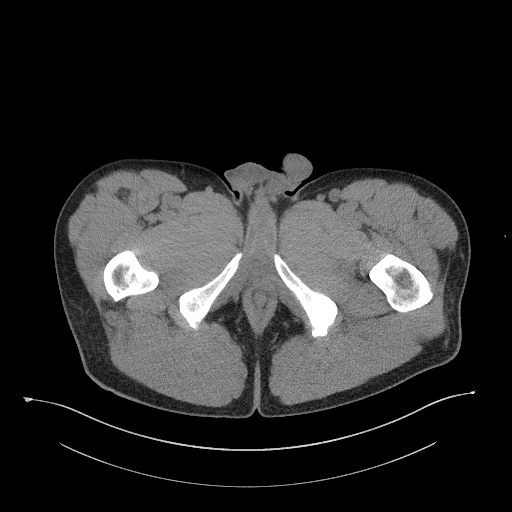
[im 20/97  soft-tissue]
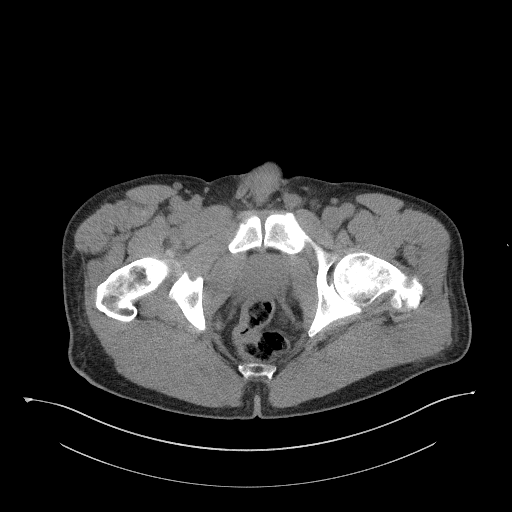
[im 27/97  soft-tissue]
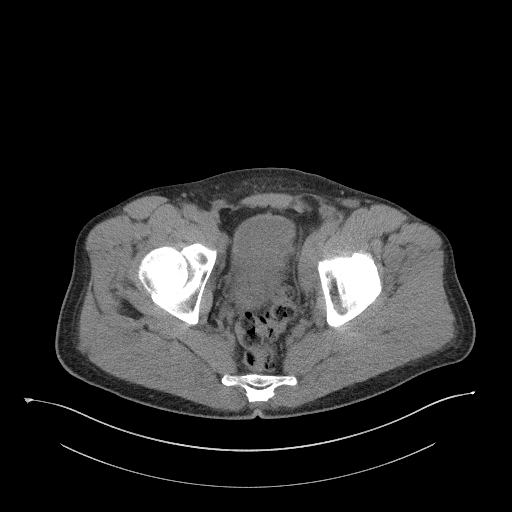
[im 35/97  soft-tissue]
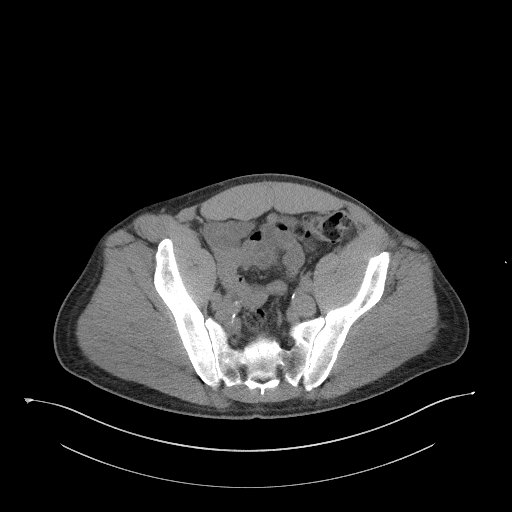
[im 43/97  soft-tissue]
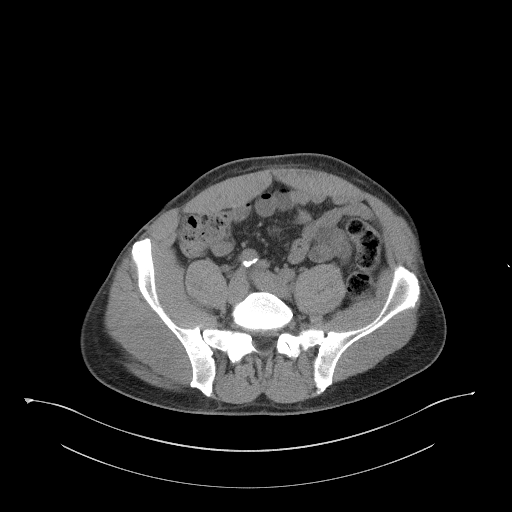
[im 50/97  soft-tissue]
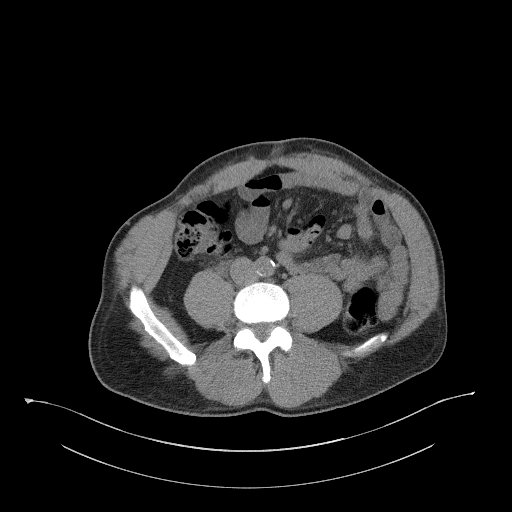
[im 54/97  soft-tissue]
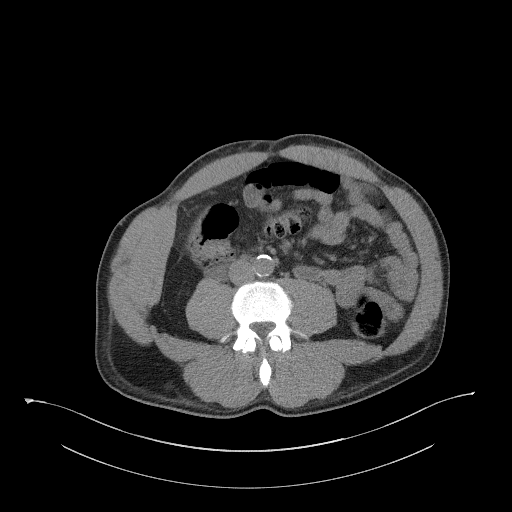
[im 62/97  soft-tissue]
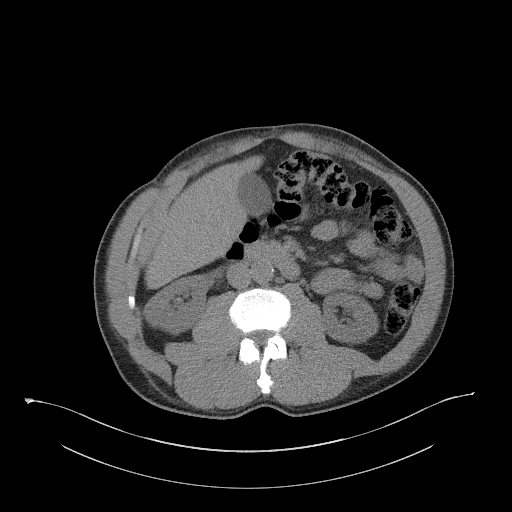
[im 62/97  bone]
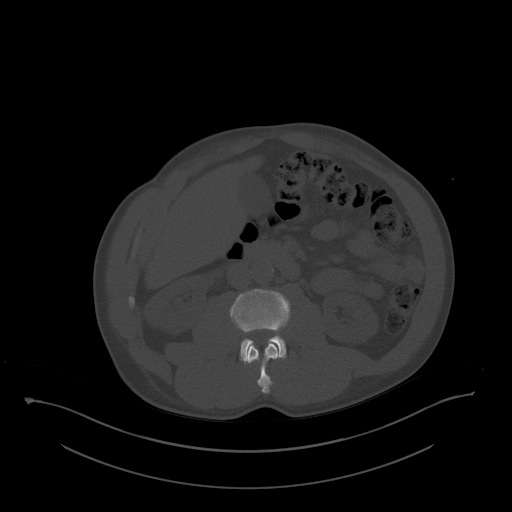
[im 70/97  soft-tissue]
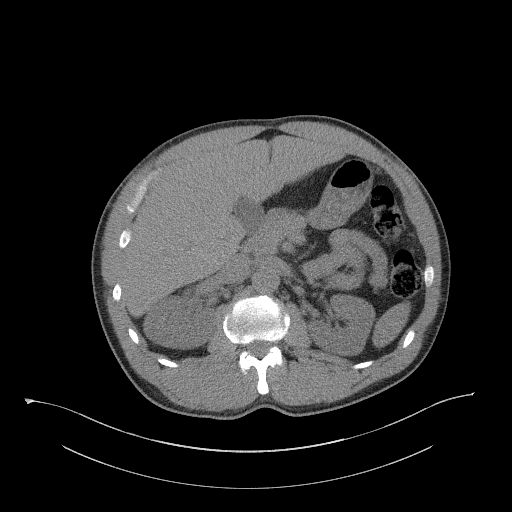
[im 77/97  soft-tissue]
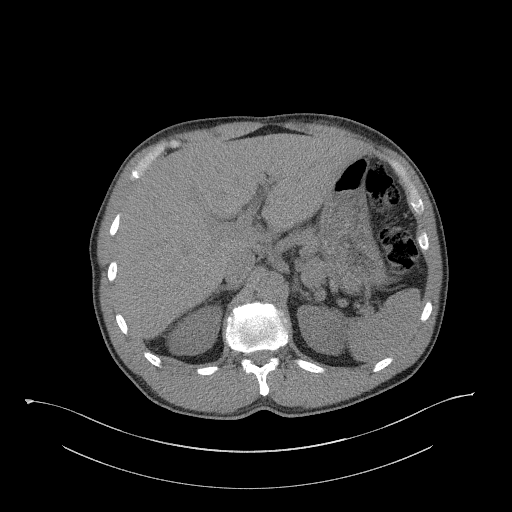
[im 85/97  soft-tissue]
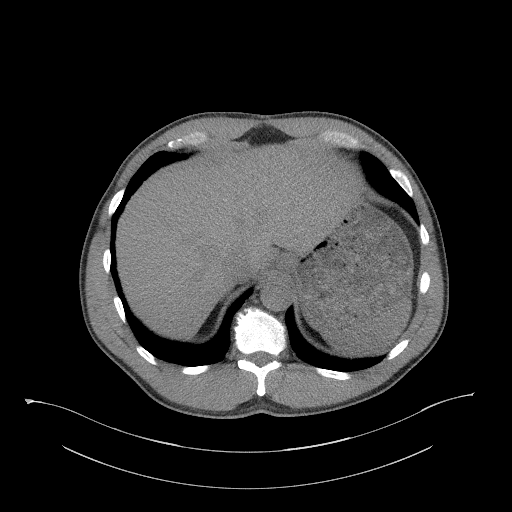
[im 93/97  soft-tissue]
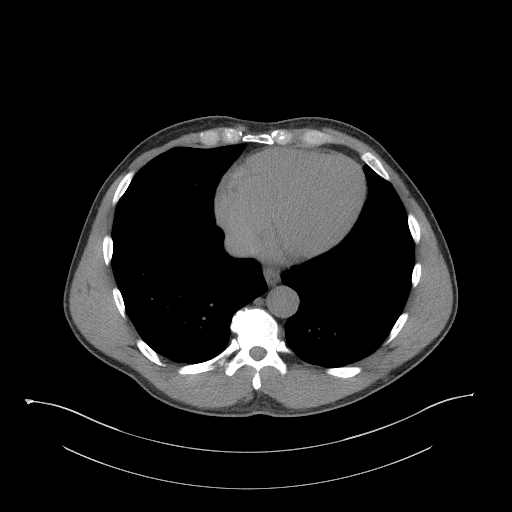

[Series 6: a/p w/o cor · coronal · non-contrast · 0.84mm/px · 3 of 151 slices shown]
[im 51/151  soft-tissue]
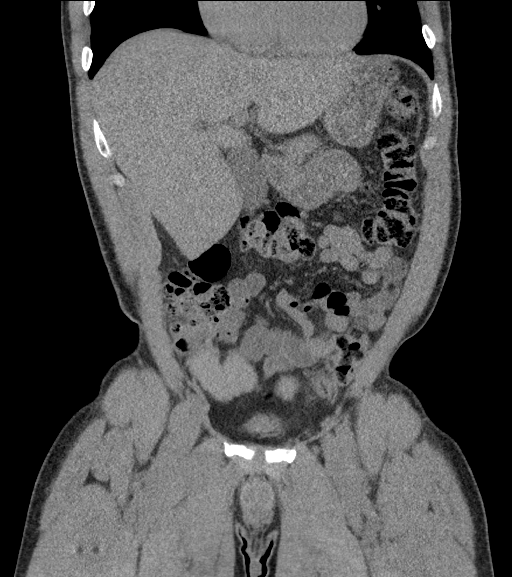
[im 67/151  soft-tissue]
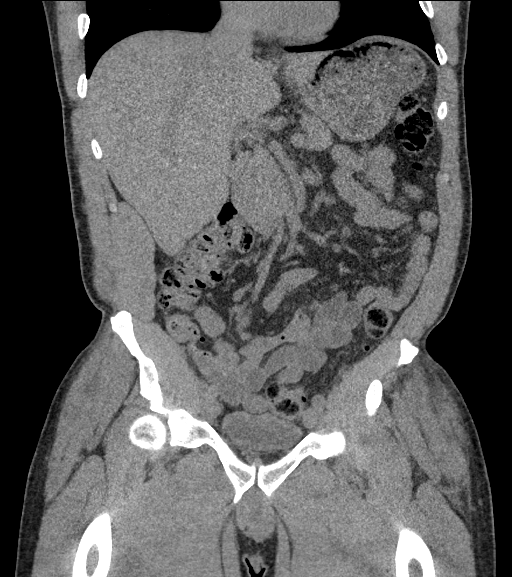
[im 84/151  soft-tissue]
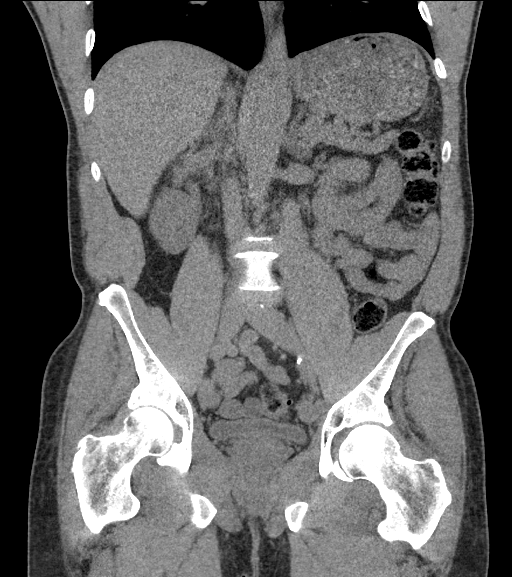

[16 of 46 positions shown; findings below may reference images not displayed]

FINDINGS: Lower chest: The lung bases are clear. No pleural fluid or acute
airspace disease.

Hepatobiliary: No focal liver abnormality is seen. No gallstones,
gallbladder wall thickening, or biliary dilatation.

Pancreas: No ductal dilatation or inflammation.

Spleen: Normal in size without focal abnormality.

Adrenals/Urinary Tract: No adrenal nodule. There is a punctate 2 mm
obstructing stone at the right ureterovesicular junction with mild
right hydroureteronephrosis. There are at least 4 additional
nonobstructing stones in the right kidney, largest measuring 3 mm
posterior mid. There are at least 5 nonobstructing stones in the
left kidney, largest in the upper pole measuring 4 mm. No left
hydronephrosis. No left ureteral stone. Minimally distended urinary
bladder. No bladder wall thickening.

Stomach/Bowel: Normal appendix, series 3, image 61. No appendicitis.
Ingested material within the stomach. Normal small bowel without
obstruction or inflammation. No colonic inflammatory change. Small
to moderate colonic stool burden.

Vascular/Lymphatic: Aorto bi-iliac atherosclerosis, moderate for
age. No aortic aneurysm. There is no bulky abdominopelvic
adenopathy.

Reproductive: Prostate is unremarkable.

Other: No ascites or free air.

Musculoskeletal: Facet hypertrophy in the lumbar spine. Scattered
bone islands. There are no acute or suspicious osseous
abnormalities.
IMPRESSION: 1. Punctate 2 mm obstructing stone at the right ureterovesicular
junction with mild right hydroureteronephrosis.
2. Additional nonobstructing stones in both kidneys.
3. Normal appendix.

Aortic Atherosclerosis ([id number]-UHA.A).

## 2021-07-16 DIAGNOSIS — E7211 Homocystinuria: Secondary | ICD-10-CM | POA: Diagnosis not present

## 2021-07-16 DIAGNOSIS — M1711 Unilateral primary osteoarthritis, right knee: Secondary | ICD-10-CM | POA: Diagnosis not present

## 2021-07-16 DIAGNOSIS — M25561 Pain in right knee: Secondary | ICD-10-CM | POA: Diagnosis not present

## 2021-07-16 DIAGNOSIS — M25531 Pain in right wrist: Secondary | ICD-10-CM | POA: Diagnosis not present

## 2021-07-16 DIAGNOSIS — E291 Testicular hypofunction: Secondary | ICD-10-CM | POA: Diagnosis not present

## 2021-10-31 DIAGNOSIS — E785 Hyperlipidemia, unspecified: Secondary | ICD-10-CM | POA: Diagnosis not present

## 2021-10-31 DIAGNOSIS — Z125 Encounter for screening for malignant neoplasm of prostate: Secondary | ICD-10-CM | POA: Diagnosis not present

## 2021-10-31 DIAGNOSIS — R7989 Other specified abnormal findings of blood chemistry: Secondary | ICD-10-CM | POA: Diagnosis not present

## 2021-11-04 DIAGNOSIS — Z0000 Encounter for general adult medical examination without abnormal findings: Secondary | ICD-10-CM | POA: Diagnosis not present

## 2021-11-04 DIAGNOSIS — Z1331 Encounter for screening for depression: Secondary | ICD-10-CM | POA: Diagnosis not present

## 2021-11-04 DIAGNOSIS — E785 Hyperlipidemia, unspecified: Secondary | ICD-10-CM | POA: Diagnosis not present

## 2021-11-04 DIAGNOSIS — R82998 Other abnormal findings in urine: Secondary | ICD-10-CM | POA: Diagnosis not present

## 2021-11-04 DIAGNOSIS — Z1339 Encounter for screening examination for other mental health and behavioral disorders: Secondary | ICD-10-CM | POA: Diagnosis not present

## 2021-11-28 DIAGNOSIS — E7211 Homocystinuria: Secondary | ICD-10-CM | POA: Diagnosis not present

## 2021-12-16 DIAGNOSIS — M21162 Varus deformity, not elsewhere classified, left knee: Secondary | ICD-10-CM | POA: Diagnosis not present

## 2021-12-16 DIAGNOSIS — M25561 Pain in right knee: Secondary | ICD-10-CM | POA: Diagnosis not present

## 2021-12-16 DIAGNOSIS — M25562 Pain in left knee: Secondary | ICD-10-CM | POA: Diagnosis not present

## 2021-12-16 DIAGNOSIS — M170 Bilateral primary osteoarthritis of knee: Secondary | ICD-10-CM | POA: Diagnosis not present

## 2021-12-16 DIAGNOSIS — M21161 Varus deformity, not elsewhere classified, right knee: Secondary | ICD-10-CM | POA: Diagnosis not present

## 2022-01-14 DIAGNOSIS — D510 Vitamin B12 deficiency anemia due to intrinsic factor deficiency: Secondary | ICD-10-CM | POA: Diagnosis not present

## 2022-01-14 DIAGNOSIS — Z6827 Body mass index (BMI) 27.0-27.9, adult: Secondary | ICD-10-CM | POA: Diagnosis not present

## 2022-01-14 DIAGNOSIS — D519 Vitamin B12 deficiency anemia, unspecified: Secondary | ICD-10-CM | POA: Diagnosis not present

## 2022-01-14 DIAGNOSIS — M21161 Varus deformity, not elsewhere classified, right knee: Secondary | ICD-10-CM | POA: Diagnosis not present

## 2022-01-14 DIAGNOSIS — R82998 Other abnormal findings in urine: Secondary | ICD-10-CM | POA: Diagnosis not present

## 2022-01-14 DIAGNOSIS — Z112 Encounter for screening for other bacterial diseases: Secondary | ICD-10-CM | POA: Diagnosis not present

## 2022-01-14 DIAGNOSIS — E7211 Homocystinuria: Secondary | ICD-10-CM | POA: Diagnosis not present

## 2022-01-14 DIAGNOSIS — E291 Testicular hypofunction: Secondary | ICD-10-CM | POA: Diagnosis not present

## 2022-01-14 DIAGNOSIS — M25561 Pain in right knee: Secondary | ICD-10-CM | POA: Diagnosis not present

## 2022-01-14 DIAGNOSIS — Z0189 Encounter for other specified special examinations: Secondary | ICD-10-CM | POA: Diagnosis not present

## 2022-01-22 DIAGNOSIS — M25561 Pain in right knee: Secondary | ICD-10-CM | POA: Diagnosis not present

## 2022-01-22 DIAGNOSIS — M25562 Pain in left knee: Secondary | ICD-10-CM | POA: Diagnosis not present

## 2022-01-27 DIAGNOSIS — Z125 Encounter for screening for malignant neoplasm of prostate: Secondary | ICD-10-CM | POA: Diagnosis not present

## 2022-01-27 DIAGNOSIS — R7989 Other specified abnormal findings of blood chemistry: Secondary | ICD-10-CM | POA: Diagnosis not present

## 2022-01-27 DIAGNOSIS — E559 Vitamin D deficiency, unspecified: Secondary | ICD-10-CM | POA: Diagnosis not present

## 2022-01-27 DIAGNOSIS — E785 Hyperlipidemia, unspecified: Secondary | ICD-10-CM | POA: Diagnosis not present

## 2022-01-27 DIAGNOSIS — Z0001 Encounter for general adult medical examination with abnormal findings: Secondary | ICD-10-CM | POA: Diagnosis not present

## 2022-01-27 DIAGNOSIS — M170 Bilateral primary osteoarthritis of knee: Secondary | ICD-10-CM | POA: Diagnosis not present

## 2022-02-18 DIAGNOSIS — S83231A Complex tear of medial meniscus, current injury, right knee, initial encounter: Secondary | ICD-10-CM | POA: Diagnosis not present

## 2022-02-18 DIAGNOSIS — S83272A Complex tear of lateral meniscus, current injury, left knee, initial encounter: Secondary | ICD-10-CM | POA: Diagnosis not present

## 2022-02-24 DIAGNOSIS — M170 Bilateral primary osteoarthritis of knee: Secondary | ICD-10-CM | POA: Diagnosis not present

## 2023-02-05 ENCOUNTER — Ambulatory Visit: Payer: BC Managed Care – PPO | Admitting: Cardiovascular Disease

## 2023-02-09 ENCOUNTER — Telehealth: Payer: Self-pay

## 2023-02-09 NOTE — Telephone Encounter (Signed)
Awaiting records.

## 2023-03-11 ENCOUNTER — Encounter: Payer: Self-pay | Admitting: Cardiology

## 2023-03-11 ENCOUNTER — Ambulatory Visit: Payer: BC Managed Care – PPO | Attending: Cardiology | Admitting: Cardiology

## 2023-03-11 VITALS — BP 146/100 | HR 73 | Ht 70.0 in | Wt 206.0 lb

## 2023-03-11 DIAGNOSIS — I10 Essential (primary) hypertension: Secondary | ICD-10-CM

## 2023-03-11 DIAGNOSIS — I2510 Atherosclerotic heart disease of native coronary artery without angina pectoris: Secondary | ICD-10-CM | POA: Diagnosis not present

## 2023-03-11 DIAGNOSIS — E782 Mixed hyperlipidemia: Secondary | ICD-10-CM

## 2023-03-11 MED ORDER — ATORVASTATIN CALCIUM 40 MG PO TABS: 40.0000 mg | ORAL_TABLET | ORAL | 3 refills | Status: DC

## 2023-03-11 MED ORDER — METOPROLOL TARTRATE 100 MG PO TABS: ORAL_TABLET | ORAL | 0 refills | Status: DC

## 2023-03-11 NOTE — Patient Instructions (Signed)
Medication Instructions:   INCREASE ATORVASTATIN TO 40 MG ONCE DAILY= 4 OF THE 10 MG TABLETS ONCE DAILY  *If you need a refill on your cardiac medications before your next appointment, please call your pharmacy*   Lab Work:  Your physician recommends that you return for lab work in: [facility]  If you have labs (blood work) drawn today and your tests are completely normal, you will receive your results only by: MyChart Message (if you have MyChart) OR A paper copy in the mail If you have any lab test that is abnormal or we need to change your treatment, we will call you to review the results.   Testing/Procedures:    Your cardiac CT will be scheduled at   [facility] [address] [place], [place] [address] [phone]   If scheduled at [facility], please arrive at the [facility] and Children's Entrance (Entrance C2) of [facility] 30 minutes prior to test start time. You can use the FREE valet parking offered at entrance C (encouraged to control the heart rate for the test)  Proceed to the [facility] Radiology Department (first floor) to check-in and test prep.  All radiology patients and guests should use entrance C2 at [facility], accessed from [facility], even though the hospital's physical address listed is [address]      Please follow these instructions carefully (unless otherwise directed):  An IV will be required for this test and Nitroglycerin will be given.  Hold all erectile dysfunction medications at least 3 days (72 hrs) prior to test. (Ie viagra, cialis, sildenafil, tadalafil, etc)   On the Night Before the Test: Be sure to Drink plenty of water. Do not consume any caffeinated/decaffeinated beverages or chocolate 12 hours prior to your test. Do not take any antihistamines 12 hours prior to your test.   On the Day of the Test: Drink plenty of water until 1 hour prior to the test. Do  not eat any food 1 hour prior to test. You may take your regular medications prior to the test.  Take metoprolol (Lopressor) 100 MG two hours prior to test.       After the Test: Drink plenty of water. After receiving IV contrast, you may experience a mild flushed feeling. This is normal. On occasion, you may experience a mild rash up to 24 hours after the test. This is not dangerous. If this occurs, you can take Benadryl 25 mg and increase your fluid intake. If you experience trouble breathing, this can be serious. If it is severe call 911 IMMEDIATELY. If it is mild, please call our office.  We will call to schedule your test 2-4 weeks out understanding that some insurance companies will need an authorization prior to the service being performed.   For more information and frequently asked questions, please visit our website : [email or web address]  For non-scheduling related questions, please contact the cardiac imaging nurse navigator should you have any questions/concerns: Cardiac Imaging Nurse Navigators Direct Office Dial: [phone]   For scheduling needs, including cancellations and rescheduling, please call [place], [phone].    Follow-Up: At [facility], you and your health needs are our priority.  As part of our continuing mission to provide you with exceptional heart care, we have created designated Provider Care Teams.  These Care Teams include your primary Cardiologist (physician) and Advanced Practice Providers (APPs -  Physician Assistants and Nurse Practitioners) who all work together to  provide you with the care you need, when you need it.  We recommend signing up for the patient portal called "MyChart".  Sign up information is provided on this After Visit Summary.  MyChart is used to connect with patients for Virtual Visits (Telemedicine).  Patients are able to view lab/test results, encounter notes, upcoming appointments, etc.  Non-urgent  messages can be sent to your provider as well.   To learn more about what you can do with MyChart, go to [email or web address].    Your next appointment:   12 month(s)  Provider:   Olga Millers MD

## 2023-03-11 NOTE — Progress Notes (Signed)
Jason Mustard, MD Reason for referral-hyperlipidemia  HPI: 57 year old male for evaluation of hyperlipidemia at request of Andi Devon, MD.  Calcium score September 2020 15.2 which was 57 percentile.  Laboratories October 2024 showed normal TSH, LP(a) 9.6, hemoglobin 17.3, creatinine 0.98, normal liver functions, LDL 149, HDL 55, total cholesterol 161, triglycerides 122.  Cardiology now asked to evaluate.  Her sizes routinely.  He typically does not have significant dyspnea on exertion, orthopnea, PND, pedal edema or syncope.  Occasional dizziness with exercising.  He has had episodes of what he feels is reflux predominantly at night associated with waterbrash.  However he did have an episode that occurred with running 1 day.  Cardiology now asked to evaluate.  Current Outpatient Medications  Medication Sig Dispense Refill   atorvastatin (LIPITOR) 10 MG tablet Take 10 mg by mouth once a week.     Cyanocobalamin (VITAMIN B-12 PO) Take by mouth daily.     Loratadine (CLARITIN PO) Take by mouth as needed.     Nutritional Supplements (JUICE PLUS FIBRE PO) Take by mouth daily.     Omega-3 Fatty Acids (FISH OIL PO) Take by mouth daily.     Red Yeast Rice Extract (RED YEAST RICE PO) Take by mouth daily.     testosterone cypionate (DEPOTESTOSTERONE CYPIONATE) 200 MG/ML injection Inject 200 mg into the muscle once a week.     No current facility-administered medications for this visit.    No Known Allergies   Past Medical History:  Diagnosis Date   Allergy    Hyperlipidemia    no medications   Hypertension    Sleep apnea    pt does not have this anymore-loss weight    Past Surgical History:  Procedure Laterality Date   COLONOSCOPY  01/22/2015   Marina Goodell -hyperplastic polyp   ELBOW SURGERY  04/2019   KNEE SURGERY     MANDIBLE SURGERY     TONSILLECTOMY      Social History   Socioeconomic History   Marital status: Married    Spouse name: Not on file   Number  of children: 3   Years of education: Not on file   Highest education level: Not on file  Occupational History   Not on file  Tobacco Use   Smoking status: Never   Smokeless tobacco: Never  Vaping Use   Vaping status: Never Used  Substance and Sexual Activity   Alcohol use: Yes    Comment: occasionally   Drug use: No   Sexual activity: Not on file  Other Topics Concern   Not on file  Social History Narrative   Not on file   Social Drivers of Health   Financial Resource Strain: Not on file  Food Insecurity: Not on file  Transportation Needs: Not on file  Physical Activity: Not on file  Stress: Not on file  Social Connections: Unknown (07/29/2021)   Received from [facility]   Social Network    Social Network: Not on file  Intimate Partner Violence: Unknown (06/20/2021)   Received from [facility]   HITS    Physically Hurt: Not on file    Insult or Talk Down To: Not on file    Threaten Physical Harm: Not on file    Scream or Curse: Not on file    Family History  Problem Relation Age of Onset   Colon cancer Mother        35's   Colon polyps Sister    Heart  attack Brother    Esophageal cancer Neg Hx    Rectal cancer Neg Hx    Stomach cancer Neg Hx     ROS: no fevers or chills, productive cough, hemoptysis, dysphasia, odynophagia, melena, hematochezia, dysuria, hematuria, rash, seizure activity, orthopnea, PND, pedal edema, claudication. Remaining systems are negative.  Physical Exam:   Blood pressure (!) 146/100, pulse 73, height 5\' 10"  (1.778 m), weight 206 lb (93.4 kg), SpO2 97%.  General:  Well developed/well nourished in NAD Skin warm/dry Patient not depressed No peripheral clubbing Back-normal HEENT-normal/normal eyelids Neck supple/normal carotid upstroke bilaterally; no bruits; no JVD; no thyromegaly chest - CTA/ normal expansion CV - RRR/normal S1 and S2; no murmurs, rubs or gallops;  PMI nondisplaced Abdomen -NT/ND, no HSM, no mass, + bowel  sounds, no bruit 2+ femoral pulses, no bruits Ext-no edema, chords, 2+ DP Neuro-grossly nonfocal  EKG Interpretation Date/Time:  Thursday March 11 2023 11:12:29 EST Ventricular Rate:  70 PR Interval:  178 QRS Duration:  92 QT Interval:  400 QTC Calculation: 432 R Axis:   40  Text Interpretation: Normal sinus rhythm Left ventricular hypertrophy Confirmed by Olga Millers ([id number]) on 03/11/2023 11:15:42 AM     A/P  1 hyperlipidemia-given documented coronary calcification will increase Lipitor to 40 mg daily.  Check lipids and liver in 8 weeks.  Note his LP(a) is normal.  2 coronary artery disease-based on mildly elevated coronary calcium score.  Will treat with statin.  3 hypertension-patient's blood pressure is elevated today.  He would like to try and check this at home.  I have explained that our goal systolic blood pressure is 130 and diastolic blood pressure of 85.  If he runs higher than this then we will add medications as needed.  4 chest discomfort-patient had an episode of what he felt was "reflux" with running 1 day.  I would like to make sure he does not have obstructive coronary disease.  We will arrange a coronary CTA to further assess.  Note his brother had PCI at age 78.  Olga Millers, MD

## 2023-03-25 ENCOUNTER — Ambulatory Visit: Payer: BC Managed Care – PPO | Admitting: Cardiovascular Disease

## 2023-03-26 ENCOUNTER — Telehealth (HOSPITAL_COMMUNITY): Payer: Self-pay | Admitting: Emergency Medicine

## 2023-03-26 ENCOUNTER — Telehealth (HOSPITAL_COMMUNITY): Payer: Self-pay | Admitting: *Deleted

## 2023-03-26 NOTE — Telephone Encounter (Signed)
 Attempted to call patient regarding upcoming cardiac CT appointment. Left message on voicemail with name and callback number Rockwell Alexandria RN Navigator Cardiac Imaging [facility] [phone] Office [phone] Cell

## 2023-03-26 NOTE — Telephone Encounter (Signed)
Reaching out to patient to offer assistance regarding upcoming cardiac imaging study; pt verbalizes understanding of appt date/time, parking situation and where to check in, pre-test NPO status and medications ordered, and verified current allergies; name and call back number provided for further questions should they arise  Dayzee Trower RN Navigator Cardiac Imaging [facility] [phone] office [phone] cell  Patient to take 100mg metoprolol tartrate two hours prior to his cardiac CT scan. He is aware to arrive at 9am. 

## 2023-03-29 ENCOUNTER — Ambulatory Visit (HOSPITAL_COMMUNITY)
Admission: RE | Admit: 2023-03-29 | Discharge: 2023-03-29 | Disposition: A | Discharge status: Home / Self Care | Payer: BC Managed Care – PPO | Source: Ambulatory Visit | Attending: Cardiology | Admitting: Cardiology

## 2023-03-29 DIAGNOSIS — I2510 Atherosclerotic heart disease of native coronary artery without angina pectoris: Secondary | ICD-10-CM | POA: Insufficient documentation

## 2023-03-29 MED ORDER — NITROGLYCERIN 0.4 MG SL SUBL
0.8000 mg | SUBLINGUAL_TABLET | Freq: Once | SUBLINGUAL | Status: AC
  Administered 2023-03-29: 0.8 mg via SUBLINGUAL

## 2023-03-29 MED ORDER — IOHEXOL 350 MG/ML SOLN
95.0000 mL | Freq: Once | INTRAVENOUS | Status: AC | PRN
  Administered 2023-03-29: 95 mL via INTRAVENOUS

## 2023-03-29 MED ORDER — NITROGLYCERIN 0.4 MG SL SUBL
SUBLINGUAL_TABLET | SUBLINGUAL | Status: AC
  Filled 2023-03-29: qty 2

## 2023-03-30 ENCOUNTER — Telehealth: Payer: Self-pay | Admitting: Cardiology

## 2023-03-30 DIAGNOSIS — I2510 Atherosclerotic heart disease of native coronary artery without angina pectoris: Secondary | ICD-10-CM

## 2023-03-30 DIAGNOSIS — E782 Mixed hyperlipidemia: Secondary | ICD-10-CM

## 2023-03-30 MED ORDER — ROSUVASTATIN CALCIUM 10 MG PO TABS: 10.0000 mg | ORAL_TABLET | Freq: Every day | ORAL | 3 refills | Status: DC

## 2023-03-30 MED ORDER — ASPIRIN 81 MG PO TBEC
81.0000 mg | DELAYED_RELEASE_TABLET | Freq: Every day | ORAL | Status: AC
Start: 2023-03-30 — End: ?

## 2023-03-30 NOTE — Telephone Encounter (Signed)
Patient returned RN Debra's call regarding results.

## 2023-03-30 NOTE — Telephone Encounter (Signed)
 Pt would like to talk to Nurse Gavin Pound.

## 2023-03-30 NOTE — Telephone Encounter (Signed)
 Patient is returning call to discuss CT results.

## 2023-03-30 NOTE — Telephone Encounter (Signed)
 Spoke with pt, CT results discussed. He is currently only taking atorvastatin  40 mg once weekly, per the prescription bottle. Long discussion with the patient regarding LDL and relation with the elevated calcium . After long discussion, patient would like to take rosuvastatin  10 mg once daily and have lab work repeated in 8 weeks. He does want to go ahead and check labs to see where he is. New script sent to the pharmacy and Lab orders mailed to the pt

## 2023-03-30 NOTE — Telephone Encounter (Signed)
Left message for patient with Dr Creshaw's recommendations.   

## 2023-03-30 NOTE — Telephone Encounter (Signed)
 Spoke with pt, he wonders if he needs to take a baby aspirin and if he can take diclofenac? Aware will forward for dr crenshaw's review.

## 2023-03-30 NOTE — Telephone Encounter (Signed)
 Left message for pt to call.

## 2023-04-06 ENCOUNTER — Encounter: Payer: Self-pay | Admitting: *Deleted

## 2023-04-09 ENCOUNTER — Other Ambulatory Visit: Payer: Self-pay | Admitting: *Deleted

## 2023-04-09 DIAGNOSIS — I77810 Thoracic aortic ectasia: Secondary | ICD-10-CM

## 2023-04-10 LAB — LIPID PANEL
Chol/HDL Ratio: 2.9 {ratio} (ref 0.0–5.0)
Cholesterol, Total: 147 mg/dL (ref 100–199)
HDL: 51 mg/dL (ref >39)
LDL Chol Calc (NIH): 75 mg/dL (ref 0–99)
Triglycerides: 116 mg/dL (ref 0–149)
VLDL Cholesterol Cal: 21 mg/dL (ref 5–40)

## 2023-04-10 LAB — HEPATIC FUNCTION PANEL
ALT: 32 [IU]/L (ref 0–44)
AST: 35 [IU]/L (ref 0–40)
Albumin: 4.8 g/dL (ref 3.8–4.9)
Alkaline Phosphatase: 79 [IU]/L (ref 44–121)
Bilirubin Total: 0.5 mg/dL (ref 0.0–1.2)
Bilirubin, Direct: 0.17 mg/dL (ref 0.00–0.40)
Total Protein: 6.8 g/dL (ref 6.0–8.5)

## 2023-04-21 ENCOUNTER — Encounter: Payer: Self-pay | Admitting: Cardiology

## 2023-04-21 ENCOUNTER — Encounter: Payer: Self-pay | Admitting: *Deleted

## 2023-04-21 ENCOUNTER — Telehealth: Payer: BC Managed Care – PPO | Admitting: Cardiology

## 2023-04-21 DIAGNOSIS — I2510 Atherosclerotic heart disease of native coronary artery without angina pectoris: Secondary | ICD-10-CM

## 2023-04-21 DIAGNOSIS — I10 Essential (primary) hypertension: Secondary | ICD-10-CM | POA: Diagnosis not present

## 2023-04-21 DIAGNOSIS — E782 Mixed hyperlipidemia: Secondary | ICD-10-CM

## 2023-04-21 DIAGNOSIS — I77810 Thoracic aortic ectasia: Secondary | ICD-10-CM | POA: Diagnosis not present

## 2023-04-21 MED ORDER — ROSUVASTATIN CALCIUM 20 MG PO TABS: 20.0000 mg | ORAL_TABLET | Freq: Every day | ORAL | 3 refills | Status: DC

## 2023-04-21 NOTE — Addendum Note (Signed)
 Addended by: Render Carrie on: 04/21/2023 01:58 PM   Modules accepted: Orders

## 2023-04-21 NOTE — Progress Notes (Signed)
 Virtual Visit via Telephone Note   Because of Jason Rivas's co-morbid illnesses, he is at least at moderate risk for complications without adequate follow up.  This format is felt to be most appropriate for this patient at this time.  The patient did not have access to video technology/had technical difficulties with video requiring transitioning to audio format only (telephone).  All issues noted in this document were discussed and addressed.  No physical exam could be performed with this format.  Please refer to the patient's chart for his consent to telehealth for [facility].   Date:  04/21/2023   ID:  Jason Rivas, DOB [birth year], MRN [id number]  Patient Location:Home Provider Location: Home  PCP:  Onita Rush, MD  Cardiologist:  Dr Pietro  Evaluation Performed:  Follow-Up Visit  Chief Complaint:  FU TAA  History of Present Illness:    Follow-up elevated calcium  score and thoracic aortic aneurysm. Calcium  score September 2020 15.2 which was 57 percentile.  Coronary CT January 2025 showed 4.5 cm ascending aortic aneurysm mild stenosis in the LAD, minimal plaque in the right coronary artery, calcium  score 12.1 which is 43rd percentile.  Since last seen he feels well with no dyspnea, chest pain.  He exercises vigorously.  The patient does not have symptoms concerning for COVID-19 infection (fever, chills, cough, or new shortness of breath).    Past Medical History:  Diagnosis Date   Allergy    Hyperlipidemia    no medications   Hypertension    Sleep apnea    pt does not have this anymore-loss weight   Past Surgical History:  Procedure Laterality Date   COLONOSCOPY  01/22/2015   Abran -hyperplastic polyp   ELBOW SURGERY  04/2019   KNEE SURGERY     MANDIBLE SURGERY     TONSILLECTOMY       No outpatient medications have been marked as taking for the 04/21/23 encounter (Video Visit) with Pietro Redell RAMAN, MD.     Allergies:   Patient has no known  allergies.   Social History   Tobacco Use   Smoking status: Never   Smokeless tobacco: Never  Vaping Use   Vaping status: Never Used  Substance Use Topics   Alcohol  use: Yes    Comment: occasionally   Drug use: No     Family Hx: The patient's family history includes Colon cancer in his mother; Colon polyps in his sister; Heart attack in his brother. There is no history of Esophageal cancer, Rectal cancer, or Stomach cancer.  ROS:   Please see the history of present illness.    No Fever, chills  or productive cough All other systems reviewed and are negative.   Labs/Other Tests and Data Reviewed:     Recent Labs: 04/09/2023: ALT 32   Recent Lipid Panel Lab Results  Component Value Date/Time   CHOL 147 04/09/2023 02:42 PM   TRIG 116 04/09/2023 02:42 PM   HDL 51 04/09/2023 02:42 PM   CHOLHDL 2.9 04/09/2023 02:42 PM   LDLCALC 75 04/09/2023 02:42 PM    Wt Readings from Last 3 Encounters:  03/11/23 206 lb (93.4 kg)  09/04/20 202 lb 13.2 oz (92 kg)  02/13/20 192 lb (87.1 kg)     Objective:    Vital Signs:  There were no vitals taken for this visit.   NAD Answers questions appropriately Normal affect Remainder of physical examination not performed (telehealth visit; coronavirus pandemic)  ASSESSMENT & PLAN:    Thoracic aortic  aneurysm-plan follow-up CTA 6 months from most recent.  I have asked him to restrain from lifting heavy weights. Coronary disease-mild on most recent CTA.  Continue aspirin  and statin. Hyperlipidemia-recent LDL not at goal.  Will increase Crestor  to 20 mg daily.  Goal LDL 55. Hypertension-I have asked him to follow his blood pressure at home and if needed we will add ARB with goal systolic blood pressure less than 130 and diastolic less than 85.  COVID-19 Education: The importance of social distancing was discussed today.  Time:   Today, I have spent 10 minutes with the patient with telehealth technology discussing the above problems.      Medication Adjustments/Labs and Tests Ordered: Current medicines are reviewed at length with the patient today.  Concerns regarding medicines are outlined above.   Tests Ordered: No orders of the defined types were placed in this encounter.   Medication Changes: No orders of the defined types were placed in this encounter.   Follow Up:  In Person in 6 month(s)   Signed, Redell Shallow, MD  04/21/2023 12:23 PM    [facility]

## 2023-04-21 NOTE — Patient Instructions (Signed)
 Medication Instructions:   INCREASE ROSUVASTATIN  TO 20 MG ONCE DAILY= 2 OF THE 10 MG TABLETS ONCE DAILY  *If you need a refill on your cardiac medications before your next appointment, please call your pharmacy*   Lab Work:  Your physician recommends that you return for lab work in:[facility]  If you have labs (blood work) drawn today and your tests are completely normal, you will receive your results only by: MyChart Message (if you have MyChart) OR A paper copy in the mail If you have any lab test that is abnormal or we need to change your treatment, we will call you to review the results.   Follow-Up: At [facility], you and your health needs are our priority.  As part of our continuing mission to provide you with exceptional heart care, we have created designated Provider Care Teams.  These Care Teams include your primary Cardiologist (physician) and Advanced Practice Providers (APPs -  Physician Assistants and Nurse Practitioners) who all work together to provide you with the care you need, when you need it.  We recommend signing up for the patient portal called MyChart.  Sign up information is provided on this After Visit Summary.  MyChart is used to connect with patients for Virtual Visits (Telemedicine).  Patients are able to view lab/test results, encounter notes, upcoming appointments, etc.  Non-urgent messages can be sent to your provider as well.   To learn more about what you can do with MyChart, go to [email or web address].    Your next appointment:   3 month(s)  Provider:   REDELL SHALLOW MD

## 2023-06-16 ENCOUNTER — Telehealth: Payer: Self-pay | Admitting: Cardiology

## 2023-06-16 DIAGNOSIS — I10 Essential (primary) hypertension: Secondary | ICD-10-CM

## 2023-06-16 NOTE — Telephone Encounter (Signed)
 New Message:     Patient said Dr Jens Som and he, had discussed taking blood pressure medicine. Patient wants Dr Jens Som to know that he would like to take a low dose of a blood pressure medicine. He said his blood pressure have still been running high. Pt c/o BP issue:  1. What are your last 5 BP readings? 150/100, 149/96, 135/90, 132,101 138/107, 162/108 today it was 155/103   2. Are you having any other symptoms (ex. Dizziness, headache, blurred vision, passed out)?  no  3. What is your medication issue? Blood pressure is high

## 2023-06-16 NOTE — Telephone Encounter (Signed)
Pt returning nurse call. Please advise.

## 2023-06-16 NOTE — Telephone Encounter (Signed)
 Spoke to patient. Verified name and DOB.  Patient stated a blood pressure medication was discussed during video visit with Dr. Jens Som on 04/21/2023. Patient's blood pressures have been elevated: 150/100 149/96 135/90 132,101 138/107 162/108 Today it was 155/103.  Patient stated he cannot get blood work due to high blood pressure and will take ARB if needed.  Patient was informed to continue to monitor his blood pressure while on medication and bring bp log to next office visit. Patient verbalized understanding.   Patient would like prescription sent to [facility] on [address] Please advise.   Josie LPN

## 2023-06-16 NOTE — Telephone Encounter (Signed)
 Attempted to call patient back. No answer. Left VM to call back. Call back number provided.  Josie LPN

## 2023-06-18 MED ORDER — IRBESARTAN 150 MG PO TABS: 150.0000 mg | ORAL_TABLET | Freq: Every day | ORAL | 3 refills | Status: AC

## 2023-06-18 NOTE — Telephone Encounter (Signed)
Spoke with pt, Aware of dr crenshaw's recommendations.  ?New script sent to the pharmacy  ?Lab orders mailed to the pt  ?

## 2023-06-24 ENCOUNTER — Telehealth: Payer: Self-pay | Admitting: Cardiology

## 2023-06-24 NOTE — Telephone Encounter (Signed)
 Spoke to patient he stated he is suppose to have lab work tomorrow.Stated he be leaving in the morning to go on vacation.Advised ok to have bmet done this afternoon before 4:15 pm.

## 2023-06-24 NOTE — Telephone Encounter (Signed)
 Pt c/o medication issue:  1. Name of Medication:   irbesartan (AVAPRO) 150 MG tablet    2. How are you currently taking this medication (dosage and times per day)?  Take 1 tablet (150 mg total) by mouth daily.     3. Are you having a reaction (difficulty breathing--STAT)? No  4. What is your medication issue? Pt is requesting a callback from nurse Stanton Kidney regarding him wanting to know if he can do labs now or wait until he returns back from [place] since he'll be gone a week since starting this new medication. Please advise.

## 2023-06-25 LAB — BASIC METABOLIC PANEL WITH GFR
BUN/Creatinine Ratio: 12 (ref 9–20)
BUN: 13 mg/dL (ref 6–24)
CO2: 27 mmol/L (ref 20–29)
Calcium: 9 mg/dL (ref 8.7–10.2)
Chloride: 100 mmol/L (ref 96–106)
Creatinine, Ser: 1.11 mg/dL (ref 0.76–1.27)
Glucose: 88 mg/dL (ref 70–99)
Potassium: 4.6 mmol/L (ref 3.5–5.2)
Sodium: 142 mmol/L (ref 134–144)
eGFR: 76 mL/min/{1.73_m2} (ref >59)

## 2023-06-28 ENCOUNTER — Encounter: Payer: Self-pay | Admitting: *Deleted

## 2023-07-05 LAB — HEPATIC FUNCTION PANEL
ALT: 36 IU/L (ref 0–44)
AST: 28 IU/L (ref 0–40)
Albumin: 4.7 g/dL (ref 3.8–4.9)
Alkaline Phosphatase: 73 IU/L (ref 44–121)
Bilirubin Total: 0.4 mg/dL (ref 0.0–1.2)
Bilirubin, Direct: 0.15 mg/dL (ref 0.00–0.40)
Total Protein: 6.6 g/dL (ref 6.0–8.5)

## 2023-07-05 LAB — LIPID PANEL
Chol/HDL Ratio: 2.9 ratio (ref 0.0–5.0)
Cholesterol, Total: 131 mg/dL (ref 100–199)
HDL: 45 mg/dL (ref >39)
LDL Chol Calc (NIH): 71 mg/dL (ref 0–99)
Triglycerides: 78 mg/dL (ref 0–149)
VLDL Cholesterol Cal: 15 mg/dL (ref 5–40)

## 2023-07-06 ENCOUNTER — Encounter: Payer: Self-pay | Admitting: *Deleted

## 2023-07-06 DIAGNOSIS — I2510 Atherosclerotic heart disease of native coronary artery without angina pectoris: Secondary | ICD-10-CM

## 2023-07-06 DIAGNOSIS — E782 Mixed hyperlipidemia: Secondary | ICD-10-CM

## 2023-07-06 MED ORDER — EZETIMIBE 10 MG PO TABS: 10.0000 mg | ORAL_TABLET | Freq: Every day | ORAL | 3 refills | Status: AC

## 2023-08-02 NOTE — Progress Notes (Signed)
 HPI: Follow-up elevated calcium  score and thoracic aortic aneurysm. Calcium  score September 2020 15.2 which was 57 percentile.  Coronary CT January 2025 showed 4.5 cm ascending aortic aneurysm mild stenosis in the LAD, minimal plaque in the right coronary artery, calcium  score 12.1 which is 43rd percentile.  Since last seen the patient denies any dyspnea on exertion, orthopnea, PND, pedal edema, palpitations, syncope or chest pain.   Current Outpatient Medications  Medication Sig Dispense Refill   aspirin  EC 81 MG tablet Take 1 tablet (81 mg total) by mouth daily. Swallow whole.     ezetimibe  (ZETIA ) 10 MG tablet Take 1 tablet (10 mg total) by mouth daily. 90 tablet 3   irbesartan  (AVAPRO ) 150 MG tablet Take 1 tablet (150 mg total) by mouth daily. 90 tablet 3   montelukast (SINGULAIR) 10 MG tablet Take 10 mg by mouth at bedtime.     Nutritional Supplements (JUICE PLUS FIBRE PO) Take by mouth daily.     Omega-3 Fatty Acids (FISH OIL PO) Take by mouth daily.     testosterone  cypionate (DEPOTESTOSTERONE CYPIONATE) 200 MG/ML injection Inject 200 mg into the muscle once a week.     rosuvastatin  (CRESTOR ) 20 MG tablet Take 1 tablet (20 mg total) by mouth daily. (Patient taking differently: Take 10 mg by mouth daily.) 90 tablet 3   No current facility-administered medications for this visit.     Past Medical History:  Diagnosis Date   Allergy    Hyperlipidemia    no medications   Hypertension    Sleep apnea    pt does not have this anymore-loss weight    Past Surgical History:  Procedure Laterality Date   COLONOSCOPY  01/22/2015   Elvin Hammer -hyperplastic polyp   ELBOW SURGERY  04/2019   KNEE SURGERY     MANDIBLE SURGERY     TONSILLECTOMY      Social History   Socioeconomic History   Marital status: Married    Spouse name: Not on file   Number of children: 3   Years of education: Not on file   Highest education level: Not on file  Occupational History   Not on file   Tobacco Use   Smoking status: Never   Smokeless tobacco: Never  Vaping Use   Vaping status: Never Used  Substance and Sexual Activity   Alcohol  use: Yes    Comment: occasionally   Drug use: No   Sexual activity: Not on file  Other Topics Concern   Not on file  Social History Narrative   Not on file   Social Drivers of Health   Financial Resource Strain: Not on file  Food Insecurity: Not on file  Transportation Needs: Not on file  Physical Activity: Not on file  Stress: Not on file  Social Connections: Unknown (07/29/2021)   Received from [facility]   Social Network    Social Network: Not on file  Intimate Partner Violence: Unknown (06/20/2021)   Received from [facility]   HITS    Physically Hurt: Not on file    Insult or Talk Down To: Not on file    Threaten Physical Harm: Not on file    Scream or Curse: Not on file    Family History  Problem Relation Age of Onset   Colon cancer Mother        68's   Colon polyps Sister    Heart attack Brother    Esophageal cancer Neg Hx  Rectal cancer Neg Hx    Stomach cancer Neg Hx     ROS: no fevers or chills, productive cough, hemoptysis, dysphasia, odynophagia, melena, hematochezia, dysuria, hematuria, rash, seizure activity, orthopnea, PND, pedal edema, claudication. Remaining systems are negative.  Physical Exam: Well-developed well-nourished in no acute distress.  Skin is warm and dry.  HEENT is normal.  Neck is supple.  Chest is clear to auscultation with normal expansion.  Cardiovascular exam is regular rate and rhythm.  Abdominal exam nontender or distended. No masses palpated. Extremities show no edema. neuro grossly intact   A/P  1 thoracic aortic aneurysm-plan follow-up CTA July 2025.  Note he does lift heavy weights and I will ask him to avoid this given his aneurysm.  2 coronary artery disease-mild on previous CTA.  Continue aspirin  and statin.  3 hyperlipidemia-continue Crestor  (he did not  tolerate 20 mg secondary to myalgias but is tolerating 10 mg) and zetia ; FU L/L next 2-4 weeks; goal LDL < 55.  4 hypertension-blood pressure controlled.  Continue present medical regimen.  Alexandria Angel, MD

## 2023-08-06 ENCOUNTER — Encounter: Payer: Self-pay | Admitting: Cardiology

## 2023-08-06 ENCOUNTER — Ambulatory Visit: Payer: BC Managed Care – PPO | Attending: Cardiology | Admitting: Cardiology

## 2023-08-06 VITALS — BP 124/82 | HR 63 | Ht 70.0 in | Wt 200.0 lb

## 2023-08-06 DIAGNOSIS — I77810 Thoracic aortic ectasia: Secondary | ICD-10-CM | POA: Diagnosis not present

## 2023-08-06 DIAGNOSIS — E782 Mixed hyperlipidemia: Secondary | ICD-10-CM | POA: Diagnosis not present

## 2023-08-06 DIAGNOSIS — I2510 Atherosclerotic heart disease of native coronary artery without angina pectoris: Secondary | ICD-10-CM

## 2023-08-06 DIAGNOSIS — I10 Essential (primary) hypertension: Secondary | ICD-10-CM

## 2023-08-06 NOTE — Patient Instructions (Addendum)
 Medication Instructions:  No medication changes were made at this visit. Continue current regimen.   *If you need a refill on your cardiac medications before your next appointment, please call your pharmacy*  Lab Work: None ordered today. If you have labs (blood work) drawn today and your tests are completely normal, you will receive your results only by: MyChart Message (if you have MyChart) OR A paper copy in the mail If you have any lab test that is abnormal or we need to change your treatment, we will call you to review the results.  Testing/Procedures: None ordered today.  Follow-Up: At [facility], you and your health needs are our priority.  As part of our continuing mission to provide you with exceptional heart care, our providers are all part of one team.  This team includes your primary Cardiologist (physician) and Advanced Practice Providers or APPs (Physician Assistants and Nurse Practitioners) who all work together to provide you with the care you need, when you need it.  Your next appointment:   6 month(s)  Provider:   Alexandria Angel, MD    Call our office when you get your new blood pressure cuff to schedule a nurse visit to allow for our nurses to check the accuracy of the readings.

## 2023-09-06 ENCOUNTER — Other Ambulatory Visit: Payer: Self-pay | Admitting: Internal Medicine

## 2023-09-06 DIAGNOSIS — R221 Localized swelling, mass and lump, neck: Secondary | ICD-10-CM

## 2023-09-07 ENCOUNTER — Ambulatory Visit
Admission: RE | Admit: 2023-09-07 | Discharge: 2023-09-07 | Disposition: A | Discharge status: Home / Self Care | Source: Ambulatory Visit | Attending: Internal Medicine | Admitting: Internal Medicine

## 2023-09-07 ENCOUNTER — Encounter: Payer: Self-pay | Admitting: Internal Medicine

## 2023-09-07 DIAGNOSIS — R221 Localized swelling, mass and lump, neck: Secondary | ICD-10-CM

## 2023-09-07 MED ORDER — IOPAMIDOL (ISOVUE-370) INJECTION 76%
75.0000 mL | Freq: Once | INTRAVENOUS | Status: AC | PRN
  Administered 2023-09-07: 75 mL via INTRAVENOUS

## 2023-09-08 ENCOUNTER — Encounter: Payer: Self-pay | Admitting: Internal Medicine

## 2023-09-08 ENCOUNTER — Other Ambulatory Visit: Payer: Self-pay | Admitting: Internal Medicine

## 2023-09-08 DIAGNOSIS — E0789 Other specified disorders of thyroid: Secondary | ICD-10-CM

## 2023-09-09 ENCOUNTER — Ambulatory Visit
Admission: RE | Admit: 2023-09-09 | Discharge: 2023-09-09 | Disposition: A | Discharge status: Home / Self Care | Source: Ambulatory Visit | Attending: Internal Medicine | Admitting: Internal Medicine

## 2023-09-09 ENCOUNTER — Encounter: Payer: Self-pay | Admitting: Cardiology

## 2023-09-09 DIAGNOSIS — E0789 Other specified disorders of thyroid: Secondary | ICD-10-CM

## 2023-09-13 ENCOUNTER — Ambulatory Visit
Admission: RE | Admit: 2023-09-13 | Discharge: 2023-09-13 | Disposition: A | Discharge status: Home / Self Care | Source: Ambulatory Visit | Attending: Cardiology | Admitting: Cardiology

## 2023-09-13 DIAGNOSIS — I77810 Thoracic aortic ectasia: Secondary | ICD-10-CM

## 2023-09-13 MED ORDER — IOPAMIDOL (ISOVUE-370) INJECTION 76%
80.0000 mL | Freq: Once | INTRAVENOUS | Status: AC | PRN
  Administered 2023-09-13: 80 mL via INTRAVENOUS

## 2023-09-14 ENCOUNTER — Other Ambulatory Visit: Payer: BC Managed Care – PPO

## 2023-09-14 ENCOUNTER — Ambulatory Visit: Payer: Self-pay | Admitting: Cardiology

## 2023-09-14 DIAGNOSIS — I77810 Thoracic aortic ectasia: Secondary | ICD-10-CM

## 2023-10-27 ENCOUNTER — Ambulatory Visit: Payer: Self-pay | Admitting: Cardiology

## 2023-10-27 DIAGNOSIS — E041 Nontoxic single thyroid nodule: Secondary | ICD-10-CM

## 2023-10-27 LAB — HEPATIC FUNCTION PANEL
ALT: 39 IU/L (ref 0–44)
AST: 45 IU/L — ABNORMAL HIGH (ref 0–40)
Albumin: 4.5 g/dL (ref 3.8–4.9)
Alkaline Phosphatase: 58 IU/L (ref 44–121)
Bilirubin Total: 0.7 mg/dL (ref 0.0–1.2)
Bilirubin, Direct: 0.26 mg/dL (ref 0.00–0.40)
Total Protein: 6.4 g/dL (ref 6.0–8.5)

## 2023-10-27 LAB — LIPID PANEL
Chol/HDL Ratio: 2.3 ratio (ref 0.0–5.0)
Cholesterol, Total: 115 mg/dL (ref 100–199)
HDL: 51 mg/dL (ref >39)
LDL Chol Calc (NIH): 43 mg/dL (ref 0–99)
Triglycerides: 117 mg/dL (ref 0–149)
VLDL Cholesterol Cal: 21 mg/dL (ref 5–40)

## 2023-10-29 DIAGNOSIS — E041 Nontoxic single thyroid nodule: Secondary | ICD-10-CM | POA: Insufficient documentation

## 2023-10-29 NOTE — Telephone Encounter (Signed)
 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 We will refer him to Interventional Radiology to have this done.

## 2024-03-03 ENCOUNTER — Other Ambulatory Visit: Payer: Self-pay | Admitting: Cardiology

## 2024-03-03 DIAGNOSIS — I2510 Atherosclerotic heart disease of native coronary artery without angina pectoris: Secondary | ICD-10-CM

## 2024-03-03 DIAGNOSIS — E782 Mixed hyperlipidemia: Secondary | ICD-10-CM

## 2024-03-03 MED ORDER — ROSUVASTATIN CALCIUM 20 MG PO TABS: 20.0000 mg | ORAL_TABLET | Freq: Every day | ORAL | 1 refills | Status: AC

## 2024-03-28 ENCOUNTER — Telehealth: Payer: Self-pay | Admitting: Internal Medicine

## 2024-03-28 NOTE — Telephone Encounter (Signed)
 Please go ahead and schedule pt for previsit and direct colon for rectal bleeding, external polypoid rectal lesion per Dr. Abran.

## 2024-03-28 NOTE — Telephone Encounter (Signed)
 Good morning Dr. Abran,   I received a call from this patient stating that he has blood in his stool and would like to get your opinion on having a sooner colonoscopy since he is due this year any way. Patient did not want to make in appointment to discuss this information with you in person and preferred for me to send you a message. Patient also stated that he feel like he has a polyp sticking out and due to his mother having colon cancer, he would feel more comfortable if he could get this done sooner. Would you please advise on how to schedule this patient.    Thank you.

## 2024-03-28 NOTE — Telephone Encounter (Signed)
 Rock, Please contact patient and schedule him for colonoscopy in the [facility] rectal bleeding, external polypoid rectal lesion Thanks, Dr. Abran

## 2024-03-31 NOTE — Telephone Encounter (Signed)
 Left message for pt to call back to schedule previsit and colon/[facility]

## 2024-04-03 NOTE — Telephone Encounter (Signed)
 Left message for pt to call back

## 2024-04-03 NOTE — Telephone Encounter (Signed)
 Pt scheduled by pt scheduling for previsit 04/18/24 at 10:30am, Colon scheduled for 05/02/24 at 10am with Dr. Abran.

## 2024-04-11 ENCOUNTER — Other Ambulatory Visit: Payer: Self-pay | Admitting: Medical Genetics

## 2024-04-13 ENCOUNTER — Other Ambulatory Visit

## 2024-04-18 ENCOUNTER — Ambulatory Visit

## 2024-04-18 ENCOUNTER — Encounter: Payer: Self-pay | Admitting: Internal Medicine

## 2024-04-18 VITALS — Ht 70.0 in | Wt 205.0 lb

## 2024-04-18 DIAGNOSIS — Z800 Family history of malignant neoplasm of digestive organs: Secondary | ICD-10-CM

## 2024-04-18 MED ORDER — NA SULFATE-K SULFATE-MG SULF 17.5-3.13-1.6 GM/177ML PO SOLN: 1.0000 | Freq: Once | ORAL | 0 refills | Status: AC

## 2024-04-18 NOTE — Progress Notes (Signed)
 PCP MD at time of PV: Onita, MD  __________________________________________________________________________________________________________________________________________  No egg allergy known to patient  No soy allergy known to patient No issues known to pt with past sedation with any surgeries or procedures Patient denies ever being told they had issues or difficulty with intubation  No FH of Malignant Hyperthermia Pt is not on diet pills Pt is not on home 02. OSA using CPAP as needed. Pt is not on blood thinners  No A fib or A flutter Have any cardiac testing pending-- no  LOA: independent  No Chew or Snuff tobacco __________________________________________________________________________________________________________________________________________  Constipation: no  Prep: suprep  __________________________________________________________________________________________________________________________________________  PV completed with patient. Prep instructions reviewed and provided during apt. Rx sent to preferred pharmacy.  __________________________________________________________________________________________________________________________________________  Patient's chart reviewed by Norleen Schillings CNRA prior to previsit and patient appropriate for the [facility].  Previsit completed and red dot placed by patient's name on their procedure day (on provider's schedule).

## 2024-05-02 ENCOUNTER — Encounter: Admitting: Internal Medicine
